# Patient Record
Sex: Female | Born: 1955 | Race: White | Hispanic: No | Marital: Married | Smoking: Never smoker
Health system: Southern US, Community
[De-identification: ages and names within clinical notes are randomized; demographics above are authoritative.]

## PROBLEM LIST (undated history)

## (undated) DIAGNOSIS — F419 Anxiety disorder, unspecified: Secondary | ICD-10-CM

## (undated) DIAGNOSIS — T7840XA Allergy, unspecified, initial encounter: Secondary | ICD-10-CM

## (undated) DIAGNOSIS — F329 Major depressive disorder, single episode, unspecified: Secondary | ICD-10-CM

## (undated) DIAGNOSIS — N6019 Diffuse cystic mastopathy of unspecified breast: Secondary | ICD-10-CM

## (undated) DIAGNOSIS — F32A Depression, unspecified: Secondary | ICD-10-CM

## (undated) DIAGNOSIS — N809 Endometriosis, unspecified: Secondary | ICD-10-CM

## (undated) DIAGNOSIS — E559 Vitamin D deficiency, unspecified: Secondary | ICD-10-CM

## (undated) HISTORY — DX: Diffuse cystic mastopathy of unspecified breast: N60.19

## (undated) HISTORY — DX: Anxiety disorder, unspecified: F41.9

## (undated) HISTORY — PX: LAPAROTOMY: SHX154

## (undated) HISTORY — PX: LASIK: SHX215

## (undated) HISTORY — DX: Endometriosis, unspecified: N80.9

## (undated) HISTORY — DX: Depression, unspecified: F32.A

## (undated) HISTORY — DX: Vitamin D deficiency, unspecified: E55.9

## (undated) HISTORY — DX: Allergy, unspecified, initial encounter: T78.40XA

## (undated) HISTORY — DX: Major depressive disorder, single episode, unspecified: F32.9

---

## 1999-01-31 ENCOUNTER — Other Ambulatory Visit: Admission: RE | Admit: 1999-01-31 | Discharge: 1999-01-31 | Payer: Self-pay | Admitting: *Deleted

## 2000-04-21 ENCOUNTER — Other Ambulatory Visit: Admission: RE | Admit: 2000-04-21 | Discharge: 2000-04-21 | Payer: Self-pay | Admitting: Obstetrics and Gynecology

## 2001-06-21 ENCOUNTER — Other Ambulatory Visit: Admission: RE | Admit: 2001-06-21 | Discharge: 2001-06-21 | Payer: Self-pay | Admitting: Internal Medicine

## 2002-08-22 ENCOUNTER — Other Ambulatory Visit: Admission: RE | Admit: 2002-08-22 | Discharge: 2002-08-22 | Payer: Self-pay | Admitting: Obstetrics and Gynecology

## 2003-07-12 ENCOUNTER — Ambulatory Visit (HOSPITAL_COMMUNITY): Admission: RE | Admit: 2003-07-12 | Discharge: 2003-07-12 | Payer: Self-pay | Admitting: Internal Medicine

## 2003-07-12 ENCOUNTER — Other Ambulatory Visit: Admission: RE | Admit: 2003-07-12 | Discharge: 2003-07-12 | Payer: Self-pay | Admitting: Internal Medicine

## 2004-07-24 ENCOUNTER — Other Ambulatory Visit: Admission: RE | Admit: 2004-07-24 | Discharge: 2004-07-24 | Payer: Self-pay | Admitting: Internal Medicine

## 2005-03-20 ENCOUNTER — Encounter: Admission: RE | Admit: 2005-03-20 | Discharge: 2005-03-20 | Payer: Self-pay | Admitting: Radiology

## 2005-10-10 ENCOUNTER — Ambulatory Visit (HOSPITAL_BASED_OUTPATIENT_CLINIC_OR_DEPARTMENT_OTHER): Admission: RE | Admit: 2005-10-10 | Discharge: 2005-10-10 | Payer: Self-pay | Admitting: General Surgery

## 2010-05-31 ENCOUNTER — Other Ambulatory Visit: Payer: Self-pay | Admitting: Obstetrics & Gynecology

## 2013-06-21 ENCOUNTER — Other Ambulatory Visit: Payer: Self-pay | Admitting: Internal Medicine

## 2013-06-22 ENCOUNTER — Telehealth: Payer: Self-pay | Admitting: Internal Medicine

## 2013-06-22 NOTE — Telephone Encounter (Signed)
Left message for pt to call and sch a follow up  Thanks, kat

## 2013-06-27 ENCOUNTER — Ambulatory Visit (INDEPENDENT_AMBULATORY_CARE_PROVIDER_SITE_OTHER): Payer: BC Managed Care – PPO | Admitting: Physician Assistant

## 2013-06-27 VITALS — BP 102/62 | HR 64 | Temp 98.1°F | Resp 16 | Ht 69.0 in | Wt 176.0 lb

## 2013-06-27 DIAGNOSIS — Z1331 Encounter for screening for depression: Secondary | ICD-10-CM

## 2013-06-27 DIAGNOSIS — E559 Vitamin D deficiency, unspecified: Secondary | ICD-10-CM

## 2013-06-27 DIAGNOSIS — Z79899 Other long term (current) drug therapy: Secondary | ICD-10-CM

## 2013-06-27 DIAGNOSIS — Z0000 Encounter for general adult medical examination without abnormal findings: Secondary | ICD-10-CM

## 2013-06-27 DIAGNOSIS — Z23 Encounter for immunization: Secondary | ICD-10-CM

## 2013-06-27 LAB — CBC WITH DIFFERENTIAL/PLATELET
Basophils Absolute: 0.1 10*3/uL (ref 0.0–0.1)
Basophils Relative: 1 % (ref 0–1)
Eosinophils Absolute: 0.1 10*3/uL (ref 0.0–0.7)
Eosinophils Relative: 2 % (ref 0–5)
HCT: 39 % (ref 36.0–46.0)
Hemoglobin: 13 g/dL (ref 12.0–15.0)
LYMPHS ABS: 1.6 10*3/uL (ref 0.7–4.0)
LYMPHS PCT: 25 % (ref 12–46)
MCH: 29.5 pg (ref 26.0–34.0)
MCHC: 33.3 g/dL (ref 30.0–36.0)
MCV: 88.4 fL (ref 78.0–100.0)
Monocytes Absolute: 0.6 10*3/uL (ref 0.1–1.0)
Monocytes Relative: 10 % (ref 3–12)
NEUTROS ABS: 3.9 10*3/uL (ref 1.7–7.7)
NEUTROS PCT: 62 % (ref 43–77)
PLATELETS: 224 10*3/uL (ref 150–400)
RBC: 4.41 MIL/uL (ref 3.87–5.11)
RDW: 12.9 % (ref 11.5–15.5)
WBC: 6.3 10*3/uL (ref 4.0–10.5)

## 2013-06-27 LAB — HEMOGLOBIN A1C
HEMOGLOBIN A1C: 5.3 % (ref <5.7)
Mean Plasma Glucose: 105 mg/dL (ref <117)

## 2013-06-27 MED ORDER — SERTRALINE HCL 50 MG PO TABS: 50.0000 mg | ORAL_TABLET | Freq: Every day | ORAL | Status: DC

## 2013-06-27 NOTE — Progress Notes (Signed)
Complete Physical HPI 58 y.o. female  presents for a complete physical. Her blood pressure has been controlled at home, today their BP is BP: 102/62 mmHg She does workout, pure barre, 3 times a week. She denies chest pain, shortness of breath, dizziness.  She is not on cholesterol medication and denies myalgias. Her cholesterol is at goal. The cholesterol last visit was:  LDL 115 Patient is on Vitamin D supplement.  Vitamin 60  Current Medications:  No current outpatient prescriptions on file prior to visit.   No current facility-administered medications on file prior to visit.   Health Maintenance:    There is no immunization history on file for this patient.  Tetanus: 2004 Pneumovax: N/A Flu vaccine: Declines Zostavax: N/A Pap: 2014, never abnormal pap, due 2017 MGM: 03/2013 DEXA: N/A Colonoscopy: 2008 due 2018, Dr. Randa EvensEdwards EGD: N/A  Allergies:  Allergies  Allergen Reactions  . Sulfa Antibiotics    Medical History: No past medical history on file. Surgical History: No past surgical history on file. Family History: No family history on file. Social History:  History  Substance Use Topics  . Smoking status: Not on file  . Smokeless tobacco: Not on file  . Alcohol Use: Not on file   Review of Systems: [X]  = complains of  [ ]  = denies  General: Fatigue [ ]  Fever [ ]  Chills [ ]  Weakness [ ]   Insomnia [ ] Weight change [ ]  Night sweats [ ]   Change in appetite [ ]  Eyes: Redness [ ]  Blurred vision [ ]  Diplopia [ ]  Discharge [ ]   ENT: Congestion [ ]  Sinus Pain [ ]  Post Nasal Drip [ ]  Sore Throat [ ]  Earache [ ]  hearing loss [ ]  Tinnitus [ ]  Snoring [ ]   Cardiac: Chest pain/pressure [ ]  SOB [ ]  Orthopnea [ ]   Palpitations [ ]   Paroxysmal nocturnal dyspnea[ ]  Claudication [ ]  Edema [ ]   Pulmonary: Cough [ ]  Wheezing[ ]   SOB [ ]   Pleurisy [ ]   GI: Nausea [ ]  Vomiting[ ]  Dysphagia[ ]  Heartburn[ ]  Abdominal pain [ ]  Constipation [ ] ; Diarrhea [ ]  BRBPR [ ]  Melena[ ]  Bloating [ ]   Hemorrhoids [ ]   GU: Hematuria[ ]  Dysuria [ ]  Nocturia[ ]  Urgency [ ]   Hesitancy [ ]  Discharge [ ]  Frequency [ ]   Breast:  Breast lumps [ ]   nipple discharge [ ]    Neuro: Headaches[ ]  Vertigo[ ]  Paresthesias[ ]  Spasm [ ]  Speech changes [ ]  Incoordination [ ]   Ortho: Arthritis [ ]  Joint pain [ ]  Muscle pain [ ]  Joint swelling [ ]  Back Pain [ ]  Skin:  Rash [ ]   Pruritis [ ]  Change in skin lesion [ ]   Psych: Depression[ ]  Anxiety[ ]  Confusion [ ]  Memory loss [ ]   Heme/Lypmh: Bleeding [ ]  Bruising [ ]  Enlarged lymph nodes [ ]   Endocrine: Visual blurring [ ]  Paresthesia [ ]  Polyuria [ ]  Polydypsea [ ]    Heat/cold intolerance [ ]  Hypoglycemia [ ]   Physical Exam: Estimated body mass index is 25.98 kg/(m^2) as calculated from the following:   Height as of this encounter: 5\' 9"  (1.753 m).   Weight as of this encounter: 176 lb (79.833 kg). BP 102/62  Pulse 64  Temp(Src) 98.1 F (36.7 C)  Resp 16  Ht 5\' 9"  (1.753 m)  Wt 176 lb (79.833 kg)  BMI 25.98 kg/m2 General Appearance: Well nourished, in no apparent distress. Eyes: PERRLA, EOMs, conjunctiva no swelling or erythema, normal  fundi and vessels. Sinuses: No Frontal/maxillary tenderness ENT/Mouth: Ext aud canals clear, normal light reflex with TMs without erythema, bulging.  Good dentition. No erythema, swelling, or exudate on post pharynx. Tonsils not swollen or erythematous. Hearing normal.  Neck: Supple, thyroid normal. No bruits Respiratory: Respiratory effort normal, BS equal bilaterally without rales, rhonchi, wheezing or stridor. Cardio: RRR without murmurs, rubs or gallops. Brisk peripheral pulses without edema.  Chest: symmetric, with normal excursions and percussion. Breasts: defer Abdomen: Soft, +BS. Non tender, no guarding, rebound, hernias, masses, or organomegaly. .  Lymphatics: Non tender without lymphadenopathy.  Genitourinary: defer Musculoskeletal: Full ROM all peripheral extremities,5/5 strength, and normal gait. Skin:  Warm, dry without rashes, lesions, ecchymosis.  Neuro: Cranial nerves intact, reflexes equal bilaterally. Normal muscle tone, no cerebellar symptoms. Sensation intact.  Psych: Awake and oriented X 3, normal affect, Insight and Judgment appropriate.   EKG: WNL no changes.  Assessment and Plan: Screening for depression- neg continue zoloft Health Maintenance TDAP  Discussed med's effects and SE's. Screening labs and tests as requested with regular follow-up as recommended.   Colleen Alexander 3:35 PM

## 2013-06-27 NOTE — Patient Instructions (Signed)

## 2013-06-28 LAB — BASIC METABOLIC PANEL WITH GFR
BUN: 18 mg/dL (ref 6–23)
CHLORIDE: 100 meq/L (ref 96–112)
CO2: 27 meq/L (ref 19–32)
Calcium: 9.2 mg/dL (ref 8.4–10.5)
Creat: 0.79 mg/dL (ref 0.50–1.10)
GFR, Est African American: >89 mL/min
GFR, Est Non African American: 84 mL/min
Glucose, Bld: 76 mg/dL (ref 70–99)
POTASSIUM: 3.9 meq/L (ref 3.5–5.3)
SODIUM: 139 meq/L (ref 135–145)

## 2013-06-28 LAB — URINALYSIS, ROUTINE W REFLEX MICROSCOPIC
Bilirubin Urine: NEGATIVE
GLUCOSE, UA: NEGATIVE mg/dL
HGB URINE DIPSTICK: NEGATIVE
Ketones, ur: NEGATIVE mg/dL
LEUKOCYTES UA: NEGATIVE
Nitrite: NEGATIVE
PH: 6.5 (ref 5.0–8.0)
Protein, ur: NEGATIVE mg/dL
SPECIFIC GRAVITY, URINE: 1.027 (ref 1.005–1.030)
Urobilinogen, UA: 0.2 mg/dL (ref 0.0–1.0)

## 2013-06-28 LAB — LIPID PANEL
CHOL/HDL RATIO: 2.7 ratio
Cholesterol: 173 mg/dL (ref 0–200)
HDL: 64 mg/dL (ref >39)
LDL Cholesterol: 79 mg/dL (ref 0–99)
Triglycerides: 151 mg/dL — ABNORMAL HIGH (ref <150)
VLDL: 30 mg/dL (ref 0–40)

## 2013-06-28 LAB — FERRITIN: Ferritin: 27 ng/mL (ref 10–291)

## 2013-06-28 LAB — HEPATIC FUNCTION PANEL
ALK PHOS: 52 U/L (ref 39–117)
ALT: 18 U/L (ref 0–35)
AST: 21 U/L (ref 0–37)
Albumin: 4.1 g/dL (ref 3.5–5.2)
BILIRUBIN DIRECT: 0.1 mg/dL (ref 0.0–0.3)
BILIRUBIN TOTAL: 0.6 mg/dL (ref 0.2–1.2)
Indirect Bilirubin: 0.5 mg/dL (ref 0.2–1.2)
Total Protein: 6.6 g/dL (ref 6.0–8.3)

## 2013-06-28 LAB — IRON AND TIBC
%SAT: 32 % (ref 20–55)
Iron: 98 ug/dL (ref 42–145)
TIBC: 307 ug/dL (ref 250–470)
UIBC: 209 ug/dL (ref 125–400)

## 2013-06-28 LAB — MAGNESIUM: Magnesium: 1.9 mg/dL (ref 1.5–2.5)

## 2013-06-28 LAB — INSULIN, FASTING: Insulin fasting, serum: 10 u[IU]/mL (ref 3–28)

## 2013-06-28 LAB — VITAMIN D 25 HYDROXY (VIT D DEFICIENCY, FRACTURES): Vit D, 25-Hydroxy: 55 ng/mL (ref 30–89)

## 2013-06-28 LAB — TSH: TSH: 1.064 u[IU]/mL (ref 0.350–4.500)

## 2013-06-28 LAB — VITAMIN B12: VITAMIN B 12: 962 pg/mL — AB (ref 211–911)

## 2013-06-28 LAB — MICROALBUMIN / CREATININE URINE RATIO
CREATININE, URINE: 124.7 mg/dL
MICROALB/CREAT RATIO: 4 mg/g (ref 0.0–30.0)
Microalb, Ur: 0.5 mg/dL (ref 0.00–1.89)

## 2014-06-28 ENCOUNTER — Other Ambulatory Visit: Payer: Self-pay | Admitting: Physician Assistant

## 2014-07-03 ENCOUNTER — Encounter: Payer: Self-pay | Admitting: Physician Assistant

## 2014-07-03 ENCOUNTER — Ambulatory Visit (INDEPENDENT_AMBULATORY_CARE_PROVIDER_SITE_OTHER): Payer: BLUE CROSS/BLUE SHIELD | Admitting: Physician Assistant

## 2014-07-03 VITALS — BP 112/64 | HR 86 | Temp 98.0°F | Resp 16 | Ht 68.75 in | Wt 177.0 lb

## 2014-07-03 DIAGNOSIS — Z79899 Other long term (current) drug therapy: Secondary | ICD-10-CM

## 2014-07-03 DIAGNOSIS — N6019 Diffuse cystic mastopathy of unspecified breast: Secondary | ICD-10-CM

## 2014-07-03 DIAGNOSIS — T7840XD Allergy, unspecified, subsequent encounter: Secondary | ICD-10-CM

## 2014-07-03 DIAGNOSIS — F329 Major depressive disorder, single episode, unspecified: Secondary | ICD-10-CM

## 2014-07-03 DIAGNOSIS — Z0001 Encounter for general adult medical examination with abnormal findings: Secondary | ICD-10-CM

## 2014-07-03 DIAGNOSIS — E559 Vitamin D deficiency, unspecified: Secondary | ICD-10-CM

## 2014-07-03 DIAGNOSIS — Z1212 Encounter for screening for malignant neoplasm of rectum: Secondary | ICD-10-CM

## 2014-07-03 DIAGNOSIS — T7840XA Allergy, unspecified, initial encounter: Secondary | ICD-10-CM | POA: Insufficient documentation

## 2014-07-03 DIAGNOSIS — F325 Major depressive disorder, single episode, in full remission: Secondary | ICD-10-CM | POA: Insufficient documentation

## 2014-07-03 DIAGNOSIS — F419 Anxiety disorder, unspecified: Secondary | ICD-10-CM

## 2014-07-03 DIAGNOSIS — R6889 Other general symptoms and signs: Secondary | ICD-10-CM

## 2014-07-03 DIAGNOSIS — E781 Pure hyperglyceridemia: Secondary | ICD-10-CM | POA: Insufficient documentation

## 2014-07-03 DIAGNOSIS — F32A Depression, unspecified: Secondary | ICD-10-CM

## 2014-07-03 LAB — BASIC METABOLIC PANEL WITH GFR
BUN: 14 mg/dL (ref 6–23)
CALCIUM: 9.4 mg/dL (ref 8.4–10.5)
CO2: 32 mEq/L (ref 19–32)
CREATININE: 0.69 mg/dL (ref 0.50–1.10)
Chloride: 102 mEq/L (ref 96–112)
Glucose, Bld: 72 mg/dL (ref 70–99)
Potassium: 4 mEq/L (ref 3.5–5.3)
Sodium: 142 mEq/L (ref 135–145)

## 2014-07-03 LAB — LIPID PANEL
Cholesterol: 198 mg/dL (ref 0–200)
HDL: 66 mg/dL (ref >=46)
LDL Cholesterol: 114 mg/dL — ABNORMAL HIGH (ref 0–99)
Total CHOL/HDL Ratio: 3 Ratio
Triglycerides: 90 mg/dL (ref <150)
VLDL: 18 mg/dL (ref 0–40)

## 2014-07-03 LAB — HEPATIC FUNCTION PANEL
ALBUMIN: 4.4 g/dL (ref 3.5–5.2)
ALK PHOS: 54 U/L (ref 39–117)
ALT: 15 U/L (ref 0–35)
AST: 20 U/L (ref 0–37)
BILIRUBIN DIRECT: 0.1 mg/dL (ref 0.0–0.3)
BILIRUBIN TOTAL: 0.6 mg/dL (ref 0.2–1.2)
Indirect Bilirubin: 0.5 mg/dL (ref 0.2–1.2)
Total Protein: 6.9 g/dL (ref 6.0–8.3)

## 2014-07-03 LAB — CBC WITH DIFFERENTIAL/PLATELET
Basophils Absolute: 0.1 10*3/uL (ref 0.0–0.1)
Basophils Relative: 1 % (ref 0–1)
EOS PCT: 1 % (ref 0–5)
Eosinophils Absolute: 0.1 10*3/uL (ref 0.0–0.7)
HEMATOCRIT: 41.4 % (ref 36.0–46.0)
HEMOGLOBIN: 13.6 g/dL (ref 12.0–15.0)
LYMPHS ABS: 1.8 10*3/uL (ref 0.7–4.0)
LYMPHS PCT: 27 % (ref 12–46)
MCH: 29.2 pg (ref 26.0–34.0)
MCHC: 32.9 g/dL (ref 30.0–36.0)
MCV: 88.8 fL (ref 78.0–100.0)
MONO ABS: 0.5 10*3/uL (ref 0.1–1.0)
MONOS PCT: 7 % (ref 3–12)
MPV: 10.7 fL (ref 8.6–12.4)
NEUTROS ABS: 4.2 10*3/uL (ref 1.7–7.7)
Neutrophils Relative %: 64 % (ref 43–77)
Platelets: 264 10*3/uL (ref 150–400)
RBC: 4.66 MIL/uL (ref 3.87–5.11)
RDW: 13.4 % (ref 11.5–15.5)
WBC: 6.6 10*3/uL (ref 4.0–10.5)

## 2014-07-03 LAB — MAGNESIUM: Magnesium: 1.9 mg/dL (ref 1.5–2.5)

## 2014-07-03 MED ORDER — SERTRALINE HCL 50 MG PO TABS: 50.0000 mg | ORAL_TABLET | Freq: Every day | ORAL | Status: DC

## 2014-07-03 NOTE — Progress Notes (Signed)
Complete Physical  Assessment and Plan: 1. Anxiety continue medications, stress management techniques discussed, increase water, good sleep hygiene discussed, increase exercise, and increase veggies.  - TSH - sertraline (ZOLOFT) 50 MG tablet; Take 1 tablet (50 mg total) by mouth daily.  Dispense: 90 tablet; Refill: 3  2. Depression - sertraline (ZOLOFT) 50 MG tablet; Take 1 tablet (50 mg total) by mouth daily.  Dispense: 90 tablet; Refill: 3  3. Vitamin D deficiency Continue supplement - Vit D  25 hydroxy (rtn osteoporosis monitoring)  4. Allergy, subsequent encounter Allergic rhinitis - Allegra OTC, increase H20, allergy hygiene explained.  5. Fibrocystic breast disease, unspecified laterality Self breast exams, get MGM 3D  6. Hypertriglyceridemia -continue medications, check lipids, decrease fatty foods, increase activity.  - Lipid panel - Microalbumin / creatinine urine ratio - Urinalysis, Routine w reflex microscopic  7. Encounter for general adult medical examination with abnormal findings  8. Medication management - CBC with Differential/Platelet - BASIC METABOLIC PANEL WITH GFR - Hepatic function panel - Magnesium  Health Maintenance Discussed med's effects and SE's. Screening labs and tests as requested with regular follow-up as recommended.  HPI 59 y.o. female  presents for a complete physical. Her blood pressure has been controlled at home, today their BP is BP: 112/64 mmHg She does workout, pure barre, 3 times a week. She denies chest pain, shortness of breath, dizziness.  She is not on cholesterol medication and denies myalgias. Her cholesterol is at goal. The cholesterol last visit was:   Lab Results  Component Value Date   CHOL 173 06/27/2013   HDL 64 06/27/2013   LDLCALC 79 06/27/2013   TRIG 151* 06/27/2013   CHOLHDL 2.7 06/27/2013   Patient is on Vitamin D supplement, Vitamin D 60 She is on zoloft for depression/anxiety which helps.  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  Her husband has lost his mom and had some deaths in the family.   Current Medications:  Current Outpatient Prescriptions on File Prior to Visit  Medication Sig Dispense Refill  . B Complex Vitamins (B COMPLEX PO) Take by mouth daily.    . Cholecalciferol (VITAMIN D PO) Take 3,600 Int'l Units by mouth daily.    Marland Kitchen. OVER THE COUNTER MEDICATION daily. VITALIZER STRIP    . OVER THE COUNTER MEDICATION daily. NUTRIFERON    . OVER THE COUNTER MEDICATION daily. OSTEOMATRIX    . OVER THE COUNTER MEDICATION daily. MENOPAUSE BALANCING COMPLEX    . OVER THE COUNTER MEDICATION daily. MINDWORKS    . OVER THE COUNTER MEDICATION daily. SOY PROTEIN DRINK IN A.M.    . sertraline (ZOLOFT) 50 MG tablet TAKE 1 TABLET (50 MG TOTAL) BY MOUTH DAILY. 90 tablet 0   No current facility-administered medications on file prior to visit.   Health Maintenance:   Immunization History  Administered Date(s) Administered  . Tdap 06/27/2013   Tetanus: 2015 Pneumovax: N/A Prevnar 13: N/A Flu vaccine: Declines Zostavax: N/A Pap: 2014, never abnormal pap, due 2017 MGM: 05/2014 at [facility] DEXA: N/A, has not had, no fractures, non smoker, no fm hx.  Colonoscopy: 2008 due 2018, Dr. Randa EvensEdwards EGD: N/A  Allergies:  Allergies  Allergen Reactions  . Sulfa Antibiotics    Medical History:  Past Medical History  Diagnosis Date  . Anxiety   . Depression   . Allergy   . Fibrocystic breast disease   . Endometriosis   . Vitamin D deficiency    Surgical History:  Past Surgical History  Procedure Laterality Date  . Lasik    . Laparotomy      for endometriosis   Family History:  Family History  Problem Relation Age of Onset  . Hypertension Mother   . Hypertension Father   . Heart disease Father   . COPD Father   . Diabetes Father    Social History:  History  Substance Use Topics  . Smoking status: Never Smoker   . Smokeless tobacco: Never  Used  . Alcohol Use: No   Review of Systems  Constitutional: Negative.   HENT: Negative.   Eyes: Negative.   Respiratory: Negative.   Cardiovascular: Negative.   Gastrointestinal: Negative.   Genitourinary: Negative.   Musculoskeletal: Negative.   Skin: Negative.  Negative for itching and rash.       Several seb keratosis all over back, would like frozen off.   Neurological: Negative.   Endo/Heme/Allergies: Negative.   Psychiatric/Behavioral: Negative.     Physical Exam: Estimated body mass index is 26.34 kg/(m^2) as calculated from the following:   Height as of this encounter: 5' 8.75" (1.746 m).   Weight as of this encounter: 177 lb (80.287 kg). BP 112/64 mmHg  Pulse 86  Temp(Src) 98 F (36.7 C) (Temporal)  Resp 16  Ht 5' 8.75" (1.746 m)  Wt 177 lb (80.287 kg)  BMI 26.34 kg/m2 General Appearance: Well nourished, in no apparent distress. Eyes: PERRLA, EOMs, conjunctiva no swelling or erythema, normal fundi and vessels. Sinuses: No Frontal/maxillary tenderness ENT/Mouth: Ext aud canals clear, normal light reflex with TMs without erythema, bulging.  Good dentition. No erythema, swelling, or exudate on post pharynx. Tonsils not swollen or erythematous. Hearing normal.  Neck: Supple, thyroid normal. No bruits Respiratory: Respiratory effort normal, BS equal bilaterally without rales, rhonchi, wheezing or stridor. Cardio: RRR without murmurs, rubs or gallops. Brisk peripheral pulses without edema.  Chest: symmetric, with normal excursions and percussion. Breasts: defer Abdomen: Soft, +BS. Non tender, no guarding, rebound, hernias, masses, or organomegaly. .  Lymphatics: Non tender without lymphadenopathy.  Genitourinary: defer Musculoskeletal: Full ROM all peripheral extremities,5/5 strength, and normal gait. Skin: Several seb keratosis on back, chest. Warm, dry without rashes,ecchymosis.  Neuro: Cranial nerves intact, reflexes equal bilaterally. Normal muscle tone, no  cerebellar symptoms. Sensation intact.  Psych: Awake and oriented X 3, normal affect, Insight and Judgment appropriate.   EKG: defer    Quentin Mulling 2:31 PM

## 2014-07-03 NOTE — Patient Instructions (Addendum)
Preventive Care for Adults A healthy lifestyle and preventive care can promote health and wellness. Preventive health guidelines for women include the following key practices.  A routine yearly physical is a good way to check with your health care provider about your health and preventive screening. It is a chance to share any concerns and updates on your health and to receive a thorough exam.  Visit your dentist for a routine exam and preventive care every 6 months. Brush your teeth twice a day and floss once a day. Good oral hygiene prevents tooth decay and gum disease.  The frequency of eye exams is based on your age, health, family medical history, use of contact lenses, and other factors. Follow your health care provider's recommendations for frequency of eye exams.  Eat a healthy diet. Foods like vegetables, fruits, whole grains, low-fat dairy products, and lean protein foods contain the nutrients you need without too many calories. Decrease your intake of foods high in solid fats, added sugars, and salt. Eat the right amount of calories for you.Get information about a proper diet from your health care provider, if necessary.  Regular physical exercise is one of the most important things you can do for your health. Most adults should get at least 150 minutes of moderate-intensity exercise (any activity that increases your heart rate and causes you to sweat) each week. In addition, most adults need muscle-strengthening exercises on 2 or more days a week.  Maintain a healthy weight. The body mass index (BMI) is a screening tool to identify possible weight problems. It provides an estimate of body fat based on height and weight. Your health care provider can find your BMI and can help you achieve or maintain a healthy weight.For adults 20 years and older:  A BMI below 18.5 is considered underweight.  A BMI of 18.5 to 24.9 is normal.  A BMI of 25 to 29.9 is considered overweight.  A BMI of  30 and above is considered obese.  Maintain normal blood lipids and cholesterol levels by exercising and minimizing your intake of saturated fat. Eat a balanced diet with plenty of fruit and vegetables. If your lipid or cholesterol levels are high, you are over 50, or you are at high risk for heart disease, you may need your cholesterol levels checked more frequently.Ongoing high lipid and cholesterol levels should be treated with medicines if diet and exercise are not working.  If you smoke, find out from your health care provider how to quit. If you do not use tobacco, do not start.  Lung cancer screening is recommended for adults aged 86-80 years who are at high risk for developing lung cancer because of a history of smoking. A yearly low-dose CT scan of the lungs is recommended for people who have at least a 30-pack-year history of smoking and are a current smoker or have quit within the past 15 years. A pack year of smoking is smoking an average of 1 pack of cigarettes a day for 1 year (for example: 1 pack a day for 30 years or 2 packs a day for 15 years). Yearly screening should continue until the smoker has stopped smoking for at least 15 years. Yearly screening should be stopped for people who develop a health problem that would prevent them from having lung cancer treatment.  Avoid use of street drugs. Do not share needles with anyone. Ask for help if you need support or instructions about stopping the use of drugs.  High blood  pressure causes heart disease and increases the risk of stroke.  Ongoing high blood pressure should be treated with medicines if weight loss and exercise do not work.  If you are 55-79 years old, ask your health care provider if you should take aspirin to prevent strokes.  Diabetes screening involves taking a blood sample to check your fasting blood sugar level. This should be done once every 3 years, after age 45, if you are within normal weight and without risk  factors for diabetes. Testing should be considered at a younger age or be carried out more frequently if you are overweight and have at least 1 risk factor for diabetes.  Breast cancer screening is essential preventive care for women. You should practice "breast self-awareness." This means understanding the normal appearance and feel of your breasts and may include breast self-examination. Any changes detected, no matter how small, should be reported to a health care provider. Women in their 20s and 30s should have a clinical breast exam (CBE) by a health care provider as part of a regular health exam every 1 to 3 years. After age 40, women should have a CBE every year. Starting at age 40, women should consider having a mammogram (breast X-ray test) every year. Women who have a family history of breast cancer should talk to their health care provider about genetic screening. Women at a high risk of breast cancer should talk to their health care providers about having an MRI and a mammogram every year.  Breast cancer gene (BRCA)-related cancer risk assessment is recommended for women who have family members with BRCA-related cancers. BRCA-related cancers include breast, ovarian, tubal, and peritoneal cancers. Having family members with these cancers may be associated with an increased risk for harmful changes (mutations) in the breast cancer genes BRCA1 and BRCA2. Results of the assessment will determine the need for genetic counseling and BRCA1 and BRCA2 testing.  Routine pelvic exams to screen for cancer are no longer recommended for nonpregnant women who are considered low risk for cancer of the pelvic organs (ovaries, uterus, and vagina) and who do not have symptoms. Ask your health care provider if a screening pelvic exam is right for you.  If you have had past treatment for cervical cancer or a condition that could lead to cancer, you need Pap tests and screening for cancer for at least 20 years after  your treatment. If Pap tests have been discontinued, your risk factors (such as having a new sexual partner) need to be reassessed to determine if screening should be resumed. Some women have medical problems that increase the chance of getting cervical cancer. In these cases, your health care provider may recommend more frequent screening and Pap tests.    Colorectal cancer can be detected and often prevented. Most routine colorectal cancer screening begins at the age of 50 years and continues through age 75 years. However, your health care provider may recommend screening at an earlier age if you have risk factors for colon cancer. On a yearly basis, your health care provider may provide home test kits to check for hidden blood in the stool. Use of a small camera at the end of a tube, to directly examine the colon (sigmoidoscopy or colonoscopy), can detect the earliest forms of colorectal cancer. Talk to your health care provider about this at age 50, when routine screening begins. Direct exam of the colon should be repeated every 5-10 years through age 75 years, unless early forms of pre-cancerous polyps   or small growths are found.  Osteoporosis is a disease in which the bones lose minerals and strength with aging. This can result in serious bone fractures or breaks. The risk of osteoporosis can be identified using a bone density scan. Women ages 62 years and over and women at risk for fractures or osteoporosis should discuss screening with their health care providers. Ask your health care provider whether you should take a calcium supplement or vitamin D to reduce the rate of osteoporosis.  Menopause can be associated with physical symptoms and risks. Hormone replacement therapy is available to decrease symptoms and risks. You should talk to your health care provider about whether hormone replacement therapy is right for you.  Use sunscreen. Apply sunscreen liberally and repeatedly throughout the day.  You should seek shade when your shadow is shorter than you. Protect yourself by wearing long sleeves, pants, a wide-brimmed hat, and sunglasses year round, whenever you are outdoors.  Once a month, do a whole body skin exam, using a mirror to look at the skin on your back. Tell your health care provider of new moles, moles that have irregular borders, moles that are larger than a pencil eraser, or moles that have changed in shape or color.  Stay current with required vaccines (immunizations).  Influenza vaccine. All adults should be immunized every year.  Tetanus, diphtheria, and acellular pertussis (Td, Tdap) vaccine. Pregnant women should receive 1 dose of Tdap vaccine during each pregnancy. The dose should be obtained regardless of the length of time since the last dose. Immunization is preferred during the 27th-36th week of gestation. An adult who has not previously received Tdap or who does not know her vaccine status should receive 1 dose of Tdap. This initial dose should be followed by tetanus and diphtheria toxoids (Td) booster doses every 10 years. Adults with an unknown or incomplete history of completing a 3-dose immunization series with Td-containing vaccines should begin or complete a primary immunization series including a Tdap dose. Adults should receive a Td booster every 10 years.    Zoster vaccine. One dose is recommended for adults aged 4 years or older unless certain conditions are present.    Pneumococcal 13-valent conjugate (PCV13) vaccine. When indicated, a person who is uncertain of her immunization history and has no record of immunization should receive the PCV13 vaccine. An adult aged 35 years or older who has certain medical conditions and has not been previously immunized should receive 1 dose of PCV13 vaccine. This PCV13 should be followed with a dose of pneumococcal polysaccharide (PPSV23) vaccine. The PPSV23 vaccine dose should be obtained at least 8 weeks after the  dose of PCV13 vaccine. An adult aged 85 years or older who has certain medical conditions and previously received 1 or more doses of PPSV23 vaccine should receive 1 dose of PCV13. The PCV13 vaccine dose should be obtained 1 or more years after the last PPSV23 vaccine dose.    Pneumococcal polysaccharide (PPSV23) vaccine. When PCV13 is also indicated, PCV13 should be obtained first. All adults aged 15 years and older should be immunized. An adult younger than age 38 years who has certain medical conditions should be immunized. Any person who resides in a nursing home or long-term care facility should be immunized. An adult smoker should be immunized. People with an immunocompromised condition and certain other conditions should receive both PCV13 and PPSV23 vaccines. People with human immunodeficiency virus (HIV) infection should be immunized as soon as possible after diagnosis. Immunization during  chemotherapy or radiation therapy should be avoided. Routine use of PPSV23 vaccine is not recommended for American Indians, [place] Natives, or people younger than 65 years unless there are medical conditions that require PPSV23 vaccine. When indicated, people who have unknown immunization and have no record of immunization should receive PPSV23 vaccine. One-time revaccination 5 years after the first dose of PPSV23 is recommended for people aged 19-64 years who have chronic kidney failure, nephrotic syndrome, asplenia, or immunocompromised conditions. People who received 1-2 doses of PPSV23 before age 38 years should receive another dose of PPSV23 vaccine at age 45 years or later if at least 5 years have passed since the previous dose. Doses of PPSV23 are not needed for people immunized with PPSV23 at or after age 12 years.   Preventive Services / Frequency  Ages 14 years and over  Blood pressure check.  Lipid and cholesterol check.  Lung cancer screening. / Every year if you are aged 50-80 years and have a  30-pack-year history of smoking and currently smoke or have quit within the past 15 years. Yearly screening is stopped once you have quit smoking for at least 15 years or develop a health problem that would prevent you from having lung cancer treatment.  Clinical breast exam.** / Every year after age 17 years.  BRCA-related cancer risk assessment.** / For women who have family members with a BRCA-related cancer (breast, ovarian, tubal, or peritoneal cancers).  Mammogram.** / Every year beginning at age 35 years and continuing for as long as you are in good health. Consult with your health care provider.  Pap test.** / Every 3 years starting at age 39 years through age 61 or 89 years with 3 consecutive normal Pap tests. Testing can be stopped between 65 and 70 years with 3 consecutive normal Pap tests and no abnormal Pap or HPV tests in the past 10 years.  Fecal occult blood test (FOBT) of stool. / Every year beginning at age 59 years and continuing until age 67 years. You may not need to do this test if you get a colonoscopy every 10 years.  Flexible sigmoidoscopy or colonoscopy.** / Every 5 years for a flexible sigmoidoscopy or every 10 years for a colonoscopy beginning at age 31 years and continuing until age 7 years.  Hepatitis C blood test.** / For all people born from [birth year] through 1964 and any individual with known risks for hepatitis C.  Osteoporosis screening.** / A one-time screening for women ages 26 years and over and women at risk for fractures or osteoporosis.  Skin self-exam. / Monthly.  Influenza vaccine. / Every year.  Tetanus, diphtheria, and acellular pertussis (Tdap/Td) vaccine.** / 1 dose of Td every 10 years.  Zoster vaccine.** / 1 dose for adults aged 65 years or older.  Pneumococcal 13-valent conjugate (PCV13) vaccine.** / Consult your health care provider.  Pneumococcal polysaccharide (PPSV23) vaccine.** / 1 dose for all adults aged 60 years and older. Screening  for abdominal aortic aneurysm (AAA)  by ultrasound is recommended for people who have history of high blood pressure or who are current or former smokers.  Diabetes is a very complicated disease...lets simplify it.  An easy way to look at it to understand the complications is if you think of the extra sugar floating in your blood stream as glass shards floating through your blood stream.    Diabetes affects your small vessels first: 1) The glass shards (sugar) scraps down the tiny blood vessels in your eyes and  lead to diabetic retinopathy, the leading cause of blindness in the [place]. Diabetes is the leading cause of newly diagnosed adult (18 to 58 years of age) blindness in the [place].  2) The glass shards scratches down the tiny vessels of your legs leading to nerve damage called neuropathy and can lead to amputations of your feet. More than 60% of all non-traumatic amputations of lower limbs occur in people with diabetes.  3) Over time the small vessels in your brain are shredded and closed off, individually this does not cause any problems but over a long period of time many of the small vessels being blocked can lead to Vascular Dementia.   4) Your kidney's are a filter system and have a "net" that keeps certain things in the body and lets bad things out. Sugar shreds this net and leads to kidney damage and eventually failure. Decreasing the sugar that is destroying the net and certain blood pressure medications can help stop or decrease progression of kidney disease. Diabetes was the primary cause of kidney failure in 44 percent of all new cases in 2011.  5) Diabetes also destroys the small vessels in your penis that lead to erectile dysfunction. Eventually the vessels are so damaged that you may not be responsive to cialis or viagra.   Diabetes and your large vessels: Your larger vessels consist of your coronary arteries in your heart and the carotid vessels to your brain. Diabetes or  even increased sugars put you at 300% increased risk of heart attack and stroke and this is why.. The sugar scrapes down your large blood vessels and your body sees this as an internal injury and tries to repair itself. Just like you get a scab on your skin, your platelets will stick to the blood vessel wall trying to heal it. This is why we have diabetics on low dose aspirin daily, this prevents the platelets from sticking and can prevent plaque formation. In addition, your body takes cholesterol and tries to shove it into the open wound. This is why we want your LDL, or bad cholesterol, below 70.   The combination of platelets and cholesterol over 5-10 years forms plaque that can break off and cause a heart attack or stroke.   PLEASE REMEMBER:  Diabetes is preventable! Up to 50 percent of complications and morbidities among individuals with type 2 diabetes can be prevented, delayed, or effectively treated and minimized with regular visits to a health professional, appropriate monitoring and medication, and a healthy diet and lifestyle.

## 2014-07-04 LAB — URINALYSIS, ROUTINE W REFLEX MICROSCOPIC
BILIRUBIN URINE: NEGATIVE
Glucose, UA: NEGATIVE mg/dL
HGB URINE DIPSTICK: NEGATIVE
KETONES UR: NEGATIVE mg/dL
Leukocytes, UA: NEGATIVE
NITRITE: NEGATIVE
Protein, ur: NEGATIVE mg/dL
Specific Gravity, Urine: 1.025 (ref 1.005–1.030)
UROBILINOGEN UA: 0.2 mg/dL (ref 0.0–1.0)
pH: 7 (ref 5.0–8.0)

## 2014-07-04 LAB — VITAMIN D 25 HYDROXY (VIT D DEFICIENCY, FRACTURES): Vit D, 25-Hydroxy: 48 ng/mL (ref 30–100)

## 2014-07-04 LAB — MICROALBUMIN / CREATININE URINE RATIO
CREATININE, URINE: 174.7 mg/dL
MICROALB/CREAT RATIO: 2.9 mg/g (ref 0.0–30.0)
Microalb, Ur: 0.5 mg/dL (ref <2.0)

## 2014-07-04 LAB — TSH: TSH: 1.199 u[IU]/mL (ref 0.350–4.500)

## 2014-08-12 ENCOUNTER — Encounter: Payer: Self-pay | Admitting: *Deleted

## 2014-08-22 ENCOUNTER — Telehealth: Payer: Self-pay | Admitting: Physician Assistant

## 2014-08-22 MED ORDER — PREDNISONE 20 MG PO TABS: ORAL_TABLET | ORAL | Status: DC

## 2014-08-22 NOTE — Telephone Encounter (Signed)
Patient called complained of sore throat for 2 days and is going out of town to see her kids. Denies any other symptoms like fever, chills, sinus issues.  Will add presdnisone 20mg  #5, make sure on allergy pill and if not better can send in ABX since only 2 days and no other accompaniments does not need ABX.   HOW TO TREAT VIRAL COUGH AND COLD SYMPTOMS:  -Symptoms usually last at least 1 week with the worst symptoms being around day 4.  - colds usually start with a sore throat and end with a cough, and the cough can take 2 weeks to get better.  -No antibiotics are needed for colds, flu, sore throats, cough, bronchitis UNLESS symptoms are longer than 7 days OR if you are getting better then get drastically worse.  -There are a lot of combination medications (Dayquil, Nyquil, Vicks 44, tyelnol cold and sinus, ETC). Please look at the ingredients on the back so that you are treating the correct symptoms and not doubling up on medications/ingredients.    Medicines you can use  Sore Throat  -Acetaminophen (Tyelnol) -Ibuprofen (Advil, motrin, aleve) -Drink a lot of water -Gargle with salt water - Rest your voice (don't talk) -Throat sprays -Cough drops  Allergy symptoms (cough, sneeze, runny nose, itchy eyes) -Claritin or loratadine cheapest but likely the weakest  -Zyrtec or certizine at night because it can make you sleepy -The strongest is allegra or fexafinadine  Cheapest at [employer], [employer], [employer]  Cough  -Dextromethorphan (Delsym)- medicine that has DM in it -Guafenesin (Mucinex/Robitussin) - cough drops - drink lots of water   General health when sick  -Hydration -wash your hands frequently -keep surfaces clean -change pillow cases and sheets often -Get fresh air but do not exercise strenuously -Vitamin D, double up on it - Vitamin C -Zinc

## 2014-08-23 ENCOUNTER — Ambulatory Visit (INDEPENDENT_AMBULATORY_CARE_PROVIDER_SITE_OTHER): Payer: BLUE CROSS/BLUE SHIELD | Admitting: Physician Assistant

## 2014-08-23 ENCOUNTER — Encounter: Payer: Self-pay | Admitting: Physician Assistant

## 2014-08-23 VITALS — BP 110/72 | HR 76 | Temp 97.7°F | Resp 16 | Ht 68.75 in | Wt 177.0 lb

## 2014-08-23 DIAGNOSIS — J029 Acute pharyngitis, unspecified: Secondary | ICD-10-CM

## 2014-08-23 MED ORDER — PREDNISONE 20 MG PO TABS: ORAL_TABLET | ORAL | Status: DC

## 2014-08-23 MED ORDER — AZITHROMYCIN 250 MG PO TABS: ORAL_TABLET | ORAL | Status: AC

## 2014-08-23 NOTE — Progress Notes (Signed)
   Subjective:    Patient ID: Colleen MurrainNancy K Straus, female    DOB: January 03, 1956, 59 y.o.   MRN: [id number]  HPI 59 y.o. female complains of symptoms of a URI. She had acute onset Saturday with fatigue and muscle aches, felt feverish, decreased appetite, and then coughing night and AM, sore throat. Has increased her supplements, increase fluids and taking tylenol. Treatment to date: none. She will be going to a wedding around little kids and her mom.   Blood pressure 110/72, pulse 76, temperature 97.7 F (36.5 C), resp. rate 16, height 5' 8.75" (1.746 m), weight 177 lb (80.287 kg).  Current Outpatient Prescriptions on File Prior to Visit  Medication Sig Dispense Refill  . B Complex Vitamins (B COMPLEX PO) Take by mouth daily.    . Cholecalciferol (VITAMIN D PO) Take 3,600 Int'l Units by mouth daily.    . Nutritional Supplements (GLA 250 PO) Take by mouth.    Marland Kitchen. OVER THE COUNTER MEDICATION daily. VITALIZER STRIP    . OVER THE COUNTER MEDICATION daily. NUTRIFERON    . OVER THE COUNTER MEDICATION daily. OSTEOMATRIX    . OVER THE COUNTER MEDICATION daily. MENOPAUSE BALANCING COMPLEX    . OVER THE COUNTER MEDICATION daily. MINDWORKS    . OVER THE COUNTER MEDICATION daily. SOY PROTEIN DRINK IN A.M.    . sertraline (ZOLOFT) 50 MG tablet Take 1 tablet (50 mg total) by mouth daily. 90 tablet 3   No current facility-administered medications on file prior to visit.   Past Medical History  Diagnosis Date  . Anxiety   . Depression   . Allergy   . Fibrocystic breast disease   . Endometriosis   . Vitamin D deficiency    Review of Systems  Constitutional: Positive for fatigue. Negative for fever, chills and diaphoresis.  HENT: Positive for congestion, ear pain, sinus pressure and sore throat. Negative for sneezing.   Respiratory: Negative.  Negative for cough and shortness of breath.   Cardiovascular: Negative.   Musculoskeletal: Negative for neck pain.  Neurological: Negative.  Negative for headaches.       Objective:   Physical Exam  Constitutional: She appears well-developed and well-nourished.  HENT:  Head: Normocephalic and atraumatic.  Right Ear: External ear normal.  Left Ear: External ear normal.  Mouth/Throat: Uvula is midline and mucous membranes are normal. Posterior oropharyngeal edema and posterior oropharyngeal erythema present.  Eyes: Conjunctivae and EOM are normal. Pupils are equal, round, and reactive to light.  Neck: Normal range of motion. Neck supple.  Cardiovascular: Normal rate, regular rhythm and normal heart sounds.   Pulmonary/Chest: Effort normal and breath sounds normal.  Abdominal: Soft. Bowel sounds are normal.  Lymphadenopathy:    She has cervical adenopathy.  Skin: Skin is warm and dry.      Assessment & Plan:  Pharyngitis: take medicationss as prescribed, increase fluids,  Salt water gargles. If symptoms do not improve in 5-7 days or get worse contact the office or go to ER. Will hold the zpak and take if she is not getting better, increase fluids, rest, cont allergy pill

## 2014-08-23 NOTE — Patient Instructions (Addendum)
Please take the prednisone to help decrease inflammation and therefore decrease symptoms, can do 1/2 pill. Take it it with food to avoid GI upset. It can cause increased energy but on the other hand it can make it hard to sleep at night so please take it AT NIGHT WITH DINNER, it takes 8-12 hours to start working so it will NOT affect your sleeping if you take it at night with your food!!  If you are diabetic it will increase your sugars so decrease carbs and monitor your sugars closely.    -Make sure you are drinking plenty of fluids to stay hydrated.  -while drinking fluids pinch and hold nose close and swallow, to help open eustachian tubes and drain fluid behind ear drums. -you can do salt water gargles. You can also do 1 TSP liquid Maalox and 1 TSP liquid benadryl- mix/ gargle/ spit  If you are not feeling better in 10-14 days, then please call the office.  Pharyngitis Pharyngitis is redness, pain, and swelling (inflammation) of your pharynx.  CAUSES  Pharyngitis is usually caused by infection. Most of the time, these infections are from viruses (viral) and are part of a cold. However, sometimes pharyngitis is caused by bacteria (bacterial). Pharyngitis can also be caused by allergies. Viral pharyngitis may be spread from person to person by coughing, sneezing, and personal items or utensils (cups, forks, spoons, toothbrushes). Bacterial pharyngitis may be spread from person to person by more intimate contact, such as kissing.  SIGNS AND SYMPTOMS  Symptoms of pharyngitis include:   Sore throat.   Tiredness (fatigue).   Low-grade fever.   Headache.  Joint pain and muscle aches.  Skin rashes.  Swollen lymph nodes.  Plaque-like film on throat or tonsils (often seen with bacterial pharyngitis). DIAGNOSIS  Your health care provider will ask you questions about your illness and your symptoms. Your medical history, along with a physical exam, is often all that is needed to  diagnose pharyngitis. Sometimes, a rapid strep test is done. Other lab tests may also be done, depending on the suspected cause.  TREATMENT  Viral pharyngitis will usually get better in 3-4 days without the use of medicine. Bacterial pharyngitis is treated with medicines that kill germs (antibiotics).  HOME CARE INSTRUCTIONS   Drink enough water and fluids to keep your urine clear or pale yellow.   Only take over-the-counter or prescription medicines as directed by your health care provider:   If you are prescribed antibiotics, make sure you finish them even if you start to feel better.   Do not take aspirin.   Get lots of rest.   Gargle with 8 oz of salt water ( tsp of salt per 1 qt of water) as often as every 1-2 hours to soothe your throat.   Throat lozenges (if you are not at risk for choking) or sprays may be used to soothe your throat. SEEK MEDICAL CARE IF:   You have large, tender lumps in your neck.  You have a rash.  You cough up green, yellow-brown, or bloody spit. SEEK IMMEDIATE MEDICAL CARE IF:   Your neck becomes stiff.  You drool or are unable to swallow liquids.  You vomit or are unable to keep medicines or liquids down.  You have severe pain that does not go away with the use of recommended medicines.  You have trouble breathing (not caused by a stuffy nose). MAKE SURE YOU:   Understand these instructions.  Will watch your condition.  Will  get help right away if you are not doing well or get worse. Document Released: 03/17/2005 Document Revised: 01/05/2013 Document Reviewed: 11/22/2012 [facility] Patient Information 2015 [place], [place]. This information is not intended to replace advice given to you by your health care provider. Make sure you discuss any questions you have with your health care provider.

## 2014-09-05 ENCOUNTER — Encounter: Payer: Self-pay | Admitting: Family Medicine

## 2014-09-05 ENCOUNTER — Other Ambulatory Visit (INDEPENDENT_AMBULATORY_CARE_PROVIDER_SITE_OTHER): Payer: BLUE CROSS/BLUE SHIELD

## 2014-09-05 ENCOUNTER — Ambulatory Visit (INDEPENDENT_AMBULATORY_CARE_PROVIDER_SITE_OTHER): Payer: BLUE CROSS/BLUE SHIELD | Admitting: Family Medicine

## 2014-09-05 VITALS — BP 116/68 | HR 84 | Ht 68.75 in | Wt 172.0 lb

## 2014-09-05 DIAGNOSIS — M25562 Pain in left knee: Secondary | ICD-10-CM

## 2014-09-05 DIAGNOSIS — M76892 Other specified enthesopathies of left lower limb, excluding foot: Secondary | ICD-10-CM

## 2014-09-05 DIAGNOSIS — M65852 Other synovitis and tenosynovitis, left thigh: Secondary | ICD-10-CM | POA: Diagnosis not present

## 2014-09-05 MED ORDER — DICLOFENAC SODIUM 2 % TD SOLN: TRANSDERMAL | Status: DC

## 2014-09-05 NOTE — Assessment & Plan Note (Signed)
Patient does have what appears to be more of a lateral hamstring tendinitis near the insertion on the fibular head. We discussed different treatment options and patient has elected try conservative therapy. Patient is going to compression, icing, topical anti-inflammatory's, as well as some over-the-counter natural supplementation. Patient will avoid certain activities and we did discuss proper shoe wear. Patient did work with Event organiserathletic trainer to learn home exercises in greater detail. Patient and will come back and see me again in 3 weeks to make sure that she is responding to conservative therapy.

## 2014-09-05 NOTE — Progress Notes (Signed)
Pre visit review using our clinic review tool, if applicable. No additional management support is needed unless otherwise documented below in the visit note. 

## 2014-09-05 NOTE — Progress Notes (Signed)
Tawana ScaleZach Rosamond Andress D.O. [facility] [address] [place], [place] [address] Phone: [phone] Subjective:    I'm seeing this patient by the request  of:  MCKEOWN,WILLIAM DAVID, MD   CC: Left leg pain  WGN:[id number]:Subjective Colleen Alexander is a 59 y.o. female coming in with complaint of left leg pain.  Patient states that she has had this pain intermittently for approximately 9 months. 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 Patient states since she stopped doing the class and she started feeling better. Patient went to a wedding and at the wedding she had severe pain again. Seems to be on the lateral aspect of the knee. Can be associated with some mild numbness.   Past Medical History  Diagnosis Date  . Anxiety   . Depression   . Allergy   . Fibrocystic breast disease   . Endometriosis   . Vitamin D deficiency    Past Surgical History  Procedure Laterality Date  . Lasik    . Laparotomy      for endometriosis   History  Substance Use Topics  . Smoking status: Never Smoker   . Smokeless tobacco: Never Used  . Alcohol Use: No   Allergies  Allergen Reactions  . Sulfa Antibiotics    Family History  Problem Relation Age of Onset  . Hypertension Mother   . Hypertension Father   . Heart disease Father   . COPD Father   . Diabetes Father        Past medical history, social, surgical and family history all reviewed in electronic medical record.   Review of Systems: No headache, visual changes, nausea, vomiting, diarrhea, constipation, dizziness, abdominal pain, skin rash, fevers, chills, night sweats, weight loss, swollen lymph nodes, body aches, joint swelling, muscle aches, chest pain, shortness of breath, mood changes.   Objective Blood pressure 116/68, pulse 84, height 5' 8.75" (1.746 m), weight 172 lb (78.019 kg), SpO2 99 %.  General: No apparent distress alert and oriented x3 mood and affect normal, dressed appropriately.  HEENT: Pupils equal,  extraocular movements intact  Respiratory: Patient's speak in full sentences and does not appear short of breath  Cardiovascular: No lower extremity edema, non tender, no erythema  Skin: Warm dry intact with no signs of infection or rash on extremities or on axial skeleton.  Abdomen: Soft nontender  Neuro: Cranial nerves II through XII are intact, neurovascularly intact in all extremities with 2+ DTRs and 2+ pulses.  Lymph: No lymphadenopathy of posterior or anterior cervical chain or axillae bilaterally.  Gait normal with good balance and coordination.  MSK:  Non tender with full range of motion and good stability and symmetric strength and tone of shoulders, elbows, wrist, hip, and ankles bilaterally.  Knee: Left Normal to inspection with no erythema or effusion or obvious bony abnormalities. Tender to palpation more over the lateral aspect of the fibular head. This distal and inferior to this area. No masses palpated ROM full in flexion and extension and lower leg rotation. Ligaments with solid consistent endpoints including ACL, PCL, LCL, MCL. Negative Mcmurray's, Apley's, and Thessalonian tests. Mild painful patellar compression Patellar glide with minimal crepitus. Patellar and quadriceps tendons unremarkable. Patient does have some discomfort with full extension as well as full flexion on the lateral aspect of the hamstrings. Mild weakness compared to the counter lateral side Contralateral side unremarkable  MSK US performed of: Left knee This study was ordered, performed, and interpreted by Terrilee FilesZach Lothar Prehn D.O.  Knee: All structures visualized. 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 Patellar Tendon unremarkable on long and transverse views without effusion. No abnormality of prepatellar bursa. LCL and MCL unremarkable on long and transverse views. Patient does have hypoechoic  changes at the insertion of the distal's hamstring near the fibular head. No true tear appreciated. Mild increase in Doppler flow but significant hypoechoic changes. No significant bony abnormality noted.  IMPRESSION:  Distal hamstring tendinitis  Procedure note 97110; 15 minutes spent for Therapeutic exercises as stated in above notes.  This included exercises focusing on stretching, strengthening, with significant focus on eccentric aspects.  Basic range of motion exercises to allow proper full motion at ankle Stretching of the lower leg and hamstrings  Theraband exercises for the lower leg - inversion, eversion, dorsiflexion and plantarflexion each to be completed with a theraband Balance exercises to increase proprioception Weight bearing exercises to increase strength and balance  Proper technique shown and discussed handout in great detail with ATC.  All questions were discussed and answered.      Impression and Recommendations:     This case required medical decision making of moderate complexity.

## 2014-09-05 NOTE — Patient Instructions (Signed)
Good to see you.  Ice 20 minutes 2 times daily. Usually after activity and before bed. Exercises 3 times a week.  Exercises on wall.  Heel and butt touching.  Raise leg 6 inches and hold 2 seconds.  Down slow for count of 4 seconds.  1 set of 30 reps daily on both sides.  Compression sleeve ([email or web address] or Dicks) Turmeric 500mg  twice daily Fish oil 2 grams daily pennsaid pinkie amount topically 2 times daily as needed.  See me again in 3 weeks.

## 2014-09-26 ENCOUNTER — Ambulatory Visit (INDEPENDENT_AMBULATORY_CARE_PROVIDER_SITE_OTHER): Payer: BLUE CROSS/BLUE SHIELD | Admitting: Family Medicine

## 2014-09-26 ENCOUNTER — Ambulatory Visit (INDEPENDENT_AMBULATORY_CARE_PROVIDER_SITE_OTHER)
Admission: RE | Admit: 2014-09-26 | Discharge: 2014-09-26 | Disposition: A | Discharge status: Home / Self Care | Payer: BLUE CROSS/BLUE SHIELD | Source: Ambulatory Visit | Attending: Family Medicine | Admitting: Family Medicine

## 2014-09-26 ENCOUNTER — Encounter: Payer: Self-pay | Admitting: Family Medicine

## 2014-09-26 VITALS — BP 128/70 | HR 80 | Ht 68.75 in | Wt 173.0 lb

## 2014-09-26 DIAGNOSIS — M25562 Pain in left knee: Secondary | ICD-10-CM | POA: Diagnosis not present

## 2014-09-26 DIAGNOSIS — M65852 Other synovitis and tenosynovitis, left thigh: Secondary | ICD-10-CM

## 2014-09-26 DIAGNOSIS — M76892 Other specified enthesopathies of left lower limb, excluding foot: Secondary | ICD-10-CM

## 2014-09-26 MED ORDER — MELOXICAM 15 MG PO TABS: 15.0000 mg | ORAL_TABLET | Freq: Every day | ORAL | Status: DC

## 2014-09-26 NOTE — Patient Instructions (Addendum)
Good to see you OK to walk up to 3 times a week. Shorten your stride  New exercises 3 times a week.  Ice the area after walking.  meloxicam daily for 10 days then as needed Continue the vitamins continue the compression for now as well.  Xray downstairs See me again in 3 weeks.  Popliteus Tendinitis with Rehab Tendonitis is a condition that is characterized by inflammation of a tendon. A tendon is the soft tissue that connects muscles to the skeletal system allowing for body movements. Popliteus tendonitis affects the popliteus tendon, which connects the popliteus muscle to the thigh bone (femur) near the knee. The popliteus muscle helps bend and rotate the knee. Popliteus tendonitis is often caused by a tendon tear (strain). Strains are classified into three categories. Grade 1 strains cause pain, but the tendon is not lengthened. Grade 2 strains include a lengthened ligament due to the ligament being stretched or partially ruptured. With grade 2 strains there is still function, although the function may be diminished. Grade 3 strains are characterized by a complete tear of the tendon or muscle, and function is usually impaired. SYMPTOMS   Pain in the knee, specifically the outer (lateral) and back (posterior) portions.  Pain that worsens with use of the popliteus muscle (standing on a slightly bent knee or rotating the knee).  A crackling sound (crepitation) when the tendon is moved or touched (uncommon, except when tested just after exercising). CAUSES Popliteus tendonitis occurs when damage to the popliteus tendon elicits an inflammatory (healing) response. Popliteus tendonitis is often an overuse injury. RISK INCREASES WITH:  Activities that require extensive running or walking downhill.  Poor strength and flexibility.  Failure to warm-up properly before activity.  Flat feet. PREVENTION  Warm up and stretch properly before activity.  Allow for adequate recovery between  workouts.  Maintain physical fitness:  Strength, flexibility, and endurance.  Cardiovascular fitness.  Learn and implement proper training regimens and sports technique.  Arch supports (orthotics) for individuals with flat feet. PROGNOSIS  If treated properly, then the symptoms of popliteus tendonitis usually resolve within 6 weeks.  RELATED COMPLICATIONS   Prolonged healing time, if improperly treated or re-injured.  Recurrent symptoms that result in a chronic problem. TREATMENT  Treatment initially involves the use of ice and medication to help reduce pain and inflammation. The use of strengthening and stretching exercises may help reduce pain with activity. These exercises may be performed at home or with referral to a therapist. Many individuals find that the use of a compression bandage or a knee sleeve helps reduce symptoms. If you have flat feet, then your caregiver may recommend arch supports. It is important to learn/modify techniques for running uphill/downhill that do not aggravate your symptoms. If symptoms persist for greater than 6 months despite conservative (non-surgical) treatment, then surgery may be recommended to remove the tendon sheath (lining).  MEDICATION   If pain medication is necessary, then nonsteroidal anti-inflammatory medications, such as aspirin and ibuprofen, or other minor pain relievers, such as acetaminophen, are often recommended.  Do not take pain medication within 7 days before surgery.  Prescription pain relievers may be given if deemed necessary by your caregiver. Use only as directed and only as much as you need. HEAT AND COLD  Cold treatment (icing) relieves pain and reduces inflammation. Cold treatment should be applied for 10 to 15 minutes every 2 to 3 hours for inflammation and pain and immediately after any activity that aggravates your symptoms. Use ice  packs or massage the area with a piece of ice (ice massage).  Heat treatment may be  used prior to performing the stretching and strengthening activities prescribed by your caregiver, physical therapist, or athletic trainer. Use a heat pack or soak the injury in warm water. SEEK MEDICAL CARE IF:  Treatment seems to offer no benefit, or the condition worsens.  Any medications produce adverse side effects. EXERCISES RANGE OF MOTION (ROM) AND STRETCHING EXERCISES - Popliteus Tendinitis These exercises may help you when beginning to rehabilitate your injury. Your symptoms may resolve with or without further involvement from your physician, physical therapist or athletic trainer. While completing these exercises, remember:   Restoring tissue flexibility helps normal motion to return to the joints. This allows healthier, less painful movement and activity.  An effective stretch should be held for at least 30 seconds.  A stretch should never be painful. You should only feel a gentle lengthening or release in the stretched tissue. STRETCH - Gastroc, Standing   Place hands on wall.  Extend right / left leg, keeping the front knee somewhat bent.  Slightly point your toes inward on your back foot.  Keeping your right / left heel on the floor and your knee straight, shift your weight toward the wall, not allowing your back to arch.  You should feel a gentle stretch in the right / left calf. Hold this position for __________ seconds. Repeat __________ times. Complete this stretch __________ times per day. STRETCH - Soleus, Standing   Place hands on wall.  Extend right / left leg, keeping the other knee somewhat bent.  Slightly point your toes inward on your back foot.  Keep your right / left heel on the floor, bend your back knee, and slightly shift your weight over the back leg so that you feel a gentle stretch deep in your back calf.  Hold this position for __________ seconds. Repeat __________ times. Complete this stretch __________ times per day. STRETCH -  Gastrocsoleus, Standing  Note: This exercise can place a lot of stress on your foot and ankle. Please complete this exercise only if specifically instructed by your caregiver.   Place the ball of your right / left foot on a step, keeping your other foot firmly on the same step.  Hold on to the wall or a rail for balance.  Slowly lift your other foot, allowing your body weight to press your heel down over the edge of the step.  You should feel a stretch in your right / left calf.  Hold this position for __________ seconds.  Repeat this exercise with a slight bend in your right / left knee. Repeat __________ times. Complete this stretch __________ times per day.  STRETCH - Hamstrings, Standing  Stand or sit and extend your right / left leg, placing your foot on a chair or foot stool  Keeping a slight arch in your low back and your hips straight forward.  Lead with your chest and lean forward at the waist until you feel a gentle stretch in the back of your right / left knee or thigh. (When done correctly, this exercise requires leaning only a small distance.)  Hold this position for __________ seconds. Repeat __________ times. Complete this stretch __________ times per day. STRETCH - Hamstrings, Supine   Lie on your back. Loop a belt or towel over the ball of your right / left foot.  Straighten your right / left knee and slowly pull on the belt to raise  your leg. Do not allow the right / left knee to bend. Keep your opposite leg flat on the floor.  Raise the leg until you feel a gentle stretch behind your right / left knee or thigh. Hold this position for __________ seconds. Repeat __________ times. Complete this stretch __________ times per day.  STRETCH - Hamstrings, Doorway  Lie on your back with your right / left leg extended and resting on the wall and the opposite leg flat on the ground through the door. Initially, position your bottom farther away from the wall than the  illustration shows.  Keep your right / left knee straight. If you feel a stretch behind your knee or thigh, hold this position for __________ seconds.  If you do not feel a stretch, scoot your bottom closer to the door, and hold __________ seconds. Repeat __________ times. Complete this stretch __________ times per day.  STRETCH - Quadriceps, Prone   Lie on your stomach on a firm surface, such as a bed or padded floor.  Bend your right / left knee and grasp your ankle. If you are unable to reach, your ankle or pant leg, use a belt around your foot to lengthen your reach.  Gently pull your heel toward your buttocks. Your knee should not slide out to the side. You should feel a stretch in the front of your thigh and/or knee.  Hold this position for __________ seconds. Repeat __________ times. Complete this stretch __________ times per day.  STRENGTHENING EXERCISES - Popliteus Tendinitis These exercises may help you when beginning to rehabilitate your injury. They may resolve your symptoms with or without further involvement from your physician, physical therapist or athletic trainer. While completing these exercises, remember:   Muscles can gain both the endurance and the strength needed for everyday activities through controlled exercises.  Complete these exercises as instructed by your physician, physical therapist or athletic trainer. Progress the resistance and repetitions only as guided. STRENGTH - Hamstring, Isometrics   Lie on your back on a firm surface.  Bend your right / left knee approximately __________ degrees.  Dig your heel into the surface as if you are trying to pull it toward your buttocks. Tighten the muscles in the back of your thighs to "dig" as hard as you can without increasing any pain.  Hold this position for __________ seconds.  Release the tension gradually and allow your muscle to completely relax for __________ seconds in between each exercise. Repeat  __________ times. Complete this exercise __________ times per day.  STRENGTH - Hamstring, Curls   Lay on your stomach with your legs extended. (If you lay on a bed, your feet may hang over the edge.)  Tighten the muscles in the back of your thigh to bend your right / left knee up to 90 degrees. Keep your hips flat on the bed/floor.  Hold this position for __________ seconds.  Slowly lower your leg back to the starting position. Repeat __________ times. Complete this exercise __________ times per day.  OPTIONAL ANKLE WEIGHTS: Begin with ____________________, but DO NOT exceed ____________________. Increase in1 lb/0.5 kg increments. Document Released: 03/17/2005 Document Revised: 06/09/2011 Document Reviewed: 06/29/2008 [facility] Patient Information 2015 [place], [place]. This information is not intended to replace advice given to you by your health care provider. Make sure you discuss any questions you have with your health care provider.

## 2014-09-26 NOTE — Progress Notes (Signed)
Pre visit review using our clinic review tool, if applicable. No additional management support is needed unless otherwise documented below in the visit note. 

## 2014-09-26 NOTE — Assessment & Plan Note (Signed)
Based on findings I do think that this pain is still secondary to the distal hamstring. Encourage patient to do the exercises on a more regular basis as well as the icing protocol. Patient will do compression sleeve which she's been doing intermittently. In addition of this we discussed different exercises more for the popliteal tendon the calcific conjoining the pain in this area. X-rays ordered today to rule out any other bony abnormality that could be contribute in. I do not believe that this is any radicular symptom from her back. Patient continues to have pain unfortunately advance imaging may be necessary. We will discuss further at follow-up in 3 weeks.

## 2014-09-26 NOTE — Progress Notes (Signed)
[facility] [address] [place], [place] [address] Phone: [phone] Subjective:    I'm seeing this patient by the request  of:  MCKEOWN,WILLIAM DAVID, MD   CC: Left leg pain follow-up  [id number] Colleen Alexander is a 59 y.o. female coming in with complaint of left leg pain.  She was seen previously and was diagnosed with more of a distal hamstring tendinitis. Patient was given home exercises, discussed compression, discussed icing. Patient states overall she has made some mild improvement. Patient states that she has days where she seems to be pain-free and other days where it is severe pain. Does not have any rhyme or reason to this. Patient states that unfortunately became stop her from activity. Patient states that the topical anti-inflammatory did not make any significant improvement. Patient still states that it seems to be on the lateral aspect of the knee. The posterior lateral aspect. Denies any foot drop or any weakness of the leg. Started the over-the-counter natural supplementation with mild improvement she would state.  Past Medical History  Diagnosis Date  . Anxiety   . Depression   . Allergy   . Fibrocystic breast disease   . Endometriosis   . Vitamin D deficiency    Past Surgical History  Procedure Laterality Date  . Lasik    . Laparotomy      for endometriosis   History  Substance Use Topics  . Smoking status: Never Smoker   . Smokeless tobacco: Never Used  . Alcohol Use: No   Allergies  Allergen Reactions  . Sulfa Antibiotics    Family History  Problem Relation Age of Onset  . Hypertension Mother   . Hypertension Father   . Heart disease Father   . COPD Father   . Diabetes Father        Past medical history, social, surgical and family history all reviewed in electronic medical record.   Review of Systems: No headache, visual changes, nausea, vomiting, diarrhea, constipation, dizziness, abdominal pain, skin rash,  fevers, chills, night sweats, weight loss, swollen lymph nodes, body aches, joint swelling, muscle aches, chest pain, shortness of breath, mood changes.   Objective Blood pressure 128/70, pulse 80, height 5' 8.75" (1.746 m), weight 173 lb (78.472 kg), SpO2 99 %.  General: No apparent distress alert and oriented x3 mood and affect normal, dressed appropriately.  HEENT: Pupils equal, extraocular movements intact  Respiratory: Patient's speak in full sentences and does not appear short of breath  Cardiovascular: No lower extremity edema, non tender, no erythema  Skin: Warm dry intact with no signs of infection or rash on extremities or on axial skeleton.  Abdomen: Soft nontender  Neuro: Cranial nerves II through XII are intact, neurovascularly intact in all extremities with 2+ DTRs and 2+ pulses.  Lymph: No lymphadenopathy of posterior or anterior cervical chain or axillae bilaterally.  Gait normal with good balance and coordination.  MSK:  Non tender with full range of motion and good stability and symmetric strength and tone of shoulders, elbows, wrist, hip, and ankles bilaterally.  Knee: Left Normal to inspection with no erythema or effusion or obvious bony abnormalities. Tender to palpation more over the lateral aspect of the fibular head. This distal and inferior to this area. No masses palpated ROM full in flexion and extension and lower leg rotation. Ligaments with solid consistent endpoints including ACL, PCL, LCL, MCL. Negative Mcmurray's, Apley's, and Thessalonian tests. Mild painful patellar compression Patellar glide with minimal  crepitus. Patellar and quadriceps tendons unremarkable. Patient does have some discomfort with full extension as well as full flexion on the lateral aspect of the hamstrings. Mild weakness compared to the counter lateral side Contralateral side unremarkable No significant change  MSK US performed of: Left knee This study was ordered, performed, and  interpreted by Terrilee FilesZach Emeree Mahler D.O.  Knee: All structures visualized. 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 Patellar Tendon unremarkable on long and transverse views without effusion. No abnormality of prepatellar bursa. LCL and MCL unremarkable on long and transverse views. Patient does have hypoechoic changes at the insertion of the distal's hamstring near the fibular head. No true tear appreciated. Mild increase in Doppler flow but significant hypoechoic changes present. Does have signs and symptoms of popliteal tendinitis on the lateral joint line as well.. No significant bony abnormality noted.  IMPRESSION:  Distal hamstring tendinitis question popliteal tendinitis       Impression and Recommendations:     This case required medical decision making of moderate complexity.

## 2014-10-04 ENCOUNTER — Other Ambulatory Visit: Payer: Self-pay | Admitting: Internal Medicine

## 2014-10-17 ENCOUNTER — Encounter: Payer: Self-pay | Admitting: Family Medicine

## 2014-10-17 ENCOUNTER — Ambulatory Visit (INDEPENDENT_AMBULATORY_CARE_PROVIDER_SITE_OTHER): Payer: BLUE CROSS/BLUE SHIELD | Admitting: Family Medicine

## 2014-10-17 VITALS — BP 122/82 | HR 67 | Wt 173.0 lb

## 2014-10-17 DIAGNOSIS — M65852 Other synovitis and tenosynovitis, left thigh: Secondary | ICD-10-CM

## 2014-10-17 DIAGNOSIS — M76892 Other specified enthesopathies of left lower limb, excluding foot: Secondary | ICD-10-CM

## 2014-10-17 NOTE — Assessment & Plan Note (Addendum)
Resolved at this time follow-up as needed

## 2014-10-17 NOTE — Patient Instructions (Addendum)
Good to see you Ice 20 minutes 2 times daily. Usually after activity and before bed. meloxicam daily for 5 days when needed OK to do anything See me when you need me :)

## 2014-10-17 NOTE — Progress Notes (Signed)
  [facility] Raheim Beutler D.O. [facility] [address] [place], [place] [address] Phone: [phone] Subjective:    I'm seeing this patient by the request  of:  MCKEOWN,WILLIAM DAVID, MD   CC: Left leg pain follow-up  OZH:[id number]:Subjective Colleen Alexander is a 59 y.o. female coming in with complaint of left leg pain.  Patient does have more of a popliteal tendinitis as well as distal hamstring tendinitis. Patient also did have x-rays that showed moderate osteophytic changes at last follow-up. Patient was to continue conservative therapy with compression, icing, and home exercises. Patient states she is 100% better. Patient is able to do the elliptical as well as dry without any significant difficulty. Patient denies any numbness or tingling. States that she can do all activities she feels fit.  Past Medical History  Diagnosis Date  . Anxiety   . Depression   . Allergy   . Fibrocystic breast disease   . Endometriosis   . Vitamin D deficiency    Past Surgical History  Procedure Laterality Date  . Lasik    . Laparotomy      for endometriosis   History  Substance Use Topics  . Smoking status: Never Smoker   . Smokeless tobacco: Never Used  . Alcohol Use: No   Allergies  Allergen Reactions  . Sulfa Antibiotics    Family History  Problem Relation Age of Onset  . Hypertension Mother   . Hypertension Father   . Heart disease Father   . COPD Father   . Diabetes Father        Past medical history, social, surgical and family history all reviewed in electronic medical record.   Review of Systems: No headache, visual changes, nausea, vomiting, diarrhea, constipation, dizziness, abdominal pain, skin rash, fevers, chills, night sweats, weight loss, swollen lymph nodes, body aches, joint swelling, muscle aches, chest pain, shortness of breath, mood changes.   Objective Blood pressure 122/82, pulse 67, weight 173 lb (78.472 kg), SpO2 98 %.  General: No apparent distress alert and  oriented x3 mood and affect normal, dressed appropriately.  HEENT: Pupils equal, extraocular movements intact  Respiratory: Patient's speak in full sentences and does not appear short of breath  Cardiovascular: No lower extremity edema, non tender, no erythema  Skin: Warm dry intact with no signs of infection or rash on extremities or on axial skeleton.  Abdomen: Soft nontender  Neuro: Cranial nerves II through XII are intact, neurovascularly intact in all extremities with 2+ DTRs and 2+ pulses.  Lymph: No lymphadenopathy of posterior or anterior cervical chain or axillae bilaterally.  Gait normal with good balance and coordination.  MSK:  Non tender with full range of motion and good stability and symmetric strength and tone of shoulders, elbows, wrist, hip, and ankles bilaterally.  Knee: Left Normal to inspection with no erythema or effusion or obvious bony abnormalities. Nontender Ligaments with solid consistent endpoints including ACL, PCL, LCL, MCL. Negative Mcmurray's, Apley's, and Thessalonian tests. Mild painful patellar compression Patellar glide with minimal crepitus. Patellar and quadriceps tendons unremarkable. Patient does have some discomfort with full extension as well as full flexion on the lateral aspect of the hamstrings. Mild weakness compared to the counter lateral side Contralateral side unremarkable No significant change    Impression and Recommendations:     This case required medical decision making of moderate complexity.

## 2015-03-21 ENCOUNTER — Ambulatory Visit (INDEPENDENT_AMBULATORY_CARE_PROVIDER_SITE_OTHER): Payer: BLUE CROSS/BLUE SHIELD | Admitting: Physician Assistant

## 2015-03-21 ENCOUNTER — Encounter: Payer: Self-pay | Admitting: Physician Assistant

## 2015-03-21 VITALS — BP 110/80 | HR 82 | Temp 97.7°F | Resp 16 | Ht 68.75 in | Wt 175.0 lb

## 2015-03-21 DIAGNOSIS — L820 Inflamed seborrheic keratosis: Secondary | ICD-10-CM

## 2015-03-21 NOTE — Progress Notes (Signed)
Chief Complaint: Patient presents for evaluation of skin lesions. Patient has erythematous, scaly lesions on trunk, lower extremity and chest, changing in shape.   Exam: seborrheic keratoses  Procedure Details   The risks, benefits, indications, potential complications, and alternatives were explained to the patient and informed consent obtained.  Liquid nitrogen was use in a 3 freeze and thaw technique. The patient tolerated the procedure well.   Condition: Stable  Complications:  None  Diagnosis: Seb. Dermatitis  Procedure code:  [id number]  Plan: 1. Patient educated that the area will begin to heal in approximately a week.  2. Warning signs of infection were reviewed.    3. Recommended that the patient use OTC acetaminophen as needed for pain.

## 2015-03-21 NOTE — Patient Instructions (Signed)
Seborrheic Keratosis Seborrheic keratosis is a common, noncancerous (benign) skin growth. This condition causes waxy, rough, tan, brown, or black spots to appear on the skin. These skin growths can be flat or raised. CAUSES The cause of this condition is not known. RISK FACTORS This condition is more likely to develop in:  People who have a family history of seborrheic keratosis.  People who are 50 or older.  People who are pregnant.  People who have had estrogen replacement therapy. SYMPTOMS This condition often occurs on the face, chest, shoulders, back, or other areas. These growths:  Are usually painless, but may become irritated and itchy.  Can be yellow, brown, black, or other colors.  Are slightly raised or have a flat surface.  Are sometimes rough or wart-like in texture.  Are often waxy on the surface.  Are round or oval-shaped.  Sometimes look like they are "stuck on."  Often occur in groups, but may occur as a single growth. DIAGNOSIS This condition is diagnosed with a medical history and physical exam. A sample of the growth may be tested (skin biopsy). You may need to see a skin specialist (dermatologist). TREATMENT Treatment is not usually needed for this condition, unless the growths are irritated or are often bleeding. You may also choose to have the growths removed if you do not like their appearance. Most commonly, these growths are treated with a procedure in which liquid nitrogen is applied to "freeze" off the growth (cryosurgery). They may also be burned off with electricity or cut off. HOME CARE INSTRUCTIONS  Watch your growth for any changes.  Keep all follow-up visits as told by your health care provider. This is important.  Do not scratch or pick at the growth or growths. This can cause them to become irritated or infected. SEEK MEDICAL CARE IF:  You suddenly have many new growths.  Your growth bleeds, itches, or hurts.  Your growth suddenly  becomes larger or changes color.   This information is not intended to replace advice given to you by your health care provider. Make sure you discuss any questions you have with your health care provider.   Document Released: 04/19/2010 Document Revised: 12/06/2014 Document Reviewed: 08/02/2014 Elsevier Interactive Patient Education 2016 [employer]  

## 2015-03-23 ENCOUNTER — Other Ambulatory Visit: Payer: Self-pay | Admitting: Internal Medicine

## 2015-07-11 ENCOUNTER — Other Ambulatory Visit (HOSPITAL_COMMUNITY)
Admission: RE | Admit: 2015-07-11 | Discharge: 2015-07-11 | Disposition: A | Discharge status: Home / Self Care | Payer: BLUE CROSS/BLUE SHIELD | Source: Ambulatory Visit | Attending: Physician Assistant | Admitting: Physician Assistant

## 2015-07-11 ENCOUNTER — Encounter: Payer: Self-pay | Admitting: Physician Assistant

## 2015-07-11 ENCOUNTER — Ambulatory Visit (INDEPENDENT_AMBULATORY_CARE_PROVIDER_SITE_OTHER): Payer: BLUE CROSS/BLUE SHIELD | Admitting: Physician Assistant

## 2015-07-11 VITALS — BP 124/70 | HR 72 | Temp 97.3°F | Resp 16 | Ht 69.0 in | Wt 174.6 lb

## 2015-07-11 DIAGNOSIS — N6019 Diffuse cystic mastopathy of unspecified breast: Secondary | ICD-10-CM

## 2015-07-11 DIAGNOSIS — R6889 Other general symptoms and signs: Secondary | ICD-10-CM

## 2015-07-11 DIAGNOSIS — R232 Flushing: Secondary | ICD-10-CM

## 2015-07-11 DIAGNOSIS — E781 Pure hyperglyceridemia: Secondary | ICD-10-CM

## 2015-07-11 DIAGNOSIS — Z0001 Encounter for general adult medical examination with abnormal findings: Secondary | ICD-10-CM

## 2015-07-11 DIAGNOSIS — E559 Vitamin D deficiency, unspecified: Secondary | ICD-10-CM | POA: Diagnosis not present

## 2015-07-11 DIAGNOSIS — Z136 Encounter for screening for cardiovascular disorders: Secondary | ICD-10-CM | POA: Diagnosis not present

## 2015-07-11 DIAGNOSIS — Z124 Encounter for screening for malignant neoplasm of cervix: Secondary | ICD-10-CM

## 2015-07-11 DIAGNOSIS — Z79899 Other long term (current) drug therapy: Secondary | ICD-10-CM | POA: Diagnosis not present

## 2015-07-11 DIAGNOSIS — Z01419 Encounter for gynecological examination (general) (routine) without abnormal findings: Secondary | ICD-10-CM | POA: Diagnosis present

## 2015-07-11 DIAGNOSIS — N951 Menopausal and female climacteric states: Secondary | ICD-10-CM

## 2015-07-11 DIAGNOSIS — Z1151 Encounter for screening for human papillomavirus (HPV): Secondary | ICD-10-CM | POA: Insufficient documentation

## 2015-07-11 DIAGNOSIS — F419 Anxiety disorder, unspecified: Secondary | ICD-10-CM

## 2015-07-11 DIAGNOSIS — T7840XD Allergy, unspecified, subsequent encounter: Secondary | ICD-10-CM

## 2015-07-11 DIAGNOSIS — Z1389 Encounter for screening for other disorder: Secondary | ICD-10-CM

## 2015-07-11 LAB — BASIC METABOLIC PANEL WITH GFR
BUN: 21 mg/dL (ref 7–25)
CHLORIDE: 103 mmol/L (ref 98–110)
CO2: 30 mmol/L (ref 20–31)
Calcium: 9.5 mg/dL (ref 8.6–10.4)
Creat: 0.78 mg/dL (ref 0.50–1.05)
GFR, EST NON AFRICAN AMERICAN: 84 mL/min (ref >=60)
GFR, Est African American: >89 mL/min (ref >=60)
Glucose, Bld: 77 mg/dL (ref 65–99)
POTASSIUM: 4.1 mmol/L (ref 3.5–5.3)
SODIUM: 140 mmol/L (ref 135–146)

## 2015-07-11 LAB — LIPID PANEL
Cholesterol: 197 mg/dL (ref 125–200)
HDL: 62 mg/dL (ref >=46)
LDL Cholesterol: 108 mg/dL (ref <130)
TRIGLYCERIDES: 135 mg/dL (ref <150)
Total CHOL/HDL Ratio: 3.2 Ratio (ref <=5.0)
VLDL: 27 mg/dL (ref <30)

## 2015-07-11 LAB — HEPATIC FUNCTION PANEL
ALBUMIN: 4.2 g/dL (ref 3.6–5.1)
ALT: 16 U/L (ref 6–29)
AST: 23 U/L (ref 10–35)
Alkaline Phosphatase: 56 U/L (ref 33–130)
BILIRUBIN DIRECT: 0.1 mg/dL (ref <=0.2)
BILIRUBIN INDIRECT: 0.4 mg/dL (ref 0.2–1.2)
BILIRUBIN TOTAL: 0.5 mg/dL (ref 0.2–1.2)
TOTAL PROTEIN: 6.7 g/dL (ref 6.1–8.1)

## 2015-07-11 LAB — CBC WITH DIFFERENTIAL/PLATELET
BASOS PCT: 1 %
Basophils Absolute: 59 cells/uL (ref 0–200)
EOS ABS: 118 {cells}/uL (ref 15–500)
Eosinophils Relative: 2 %
HCT: 40.6 % (ref 35.0–45.0)
Hemoglobin: 13.2 g/dL (ref 11.7–15.5)
Lymphocytes Relative: 26 %
Lymphs Abs: 1534 cells/uL (ref 850–3900)
MCH: 28.5 pg (ref 27.0–33.0)
MCHC: 32.5 g/dL (ref 32.0–36.0)
MCV: 87.7 fL (ref 80.0–100.0)
MONOS PCT: 8 %
MPV: 10.8 fL (ref 7.5–12.5)
Monocytes Absolute: 472 cells/uL (ref 200–950)
Neutro Abs: 3717 cells/uL (ref 1500–7800)
Neutrophils Relative %: 63 %
PLATELETS: 226 10*3/uL (ref 140–400)
RBC: 4.63 MIL/uL (ref 3.80–5.10)
RDW: 12.8 % (ref 11.0–15.0)
WBC: 5.9 10*3/uL (ref 3.8–10.8)

## 2015-07-11 LAB — MAGNESIUM: MAGNESIUM: 1.9 mg/dL (ref 1.5–2.5)

## 2015-07-11 NOTE — Progress Notes (Signed)
Complete Physical  Assessment and Plan: 1. Anxiety continue medications, stress management techniques discussed, increase water, good sleep hygiene discussed, increase exercise, and increase veggies.  - TSH - sertraline (ZOLOFT) 50 MG tablet; Take 1 tablet (50 mg total) by mouth daily.  Dispense: 90 tablet; Refill: 3  2. Depression - sertraline (ZOLOFT) 50 MG tablet; Take 1 tablet (50 mg total) by mouth daily.  Dispense: 90 tablet; Refill: 3  3. Vitamin D deficiency Continue supplement - Vit D  25 hydroxy (rtn osteoporosis monitoring)  4. Allergy, subsequent encounter - Allegra OTC, increase H20, allergy hygiene explained.  5. Fibrocystic breast disease, unspecified laterality Self breast exams, get MGM 3D  6. Hypertriglyceridemia -continue medications, check lipids, decrease fatty foods, increase activity.  - Lipid panel - Microalbumin / creatinine urine ratio - Urinalysis, Routine w reflex microscopic  7. Encounter for general adult medical examination with abnormal findings PAP done today.   8. Medication management - CBC with Differential/Platelet - BASIC METABOLIC PANEL WITH GFR - Hepatic function panel - Magnesium  Health Maintenance Discussed med's effects and SE's. Screening labs and tests as requested with regular follow-up as recommended.  HPI 60 y.o. female  presents for a complete physical. Her blood pressure has been controlled at home, today their BP is BP: 124/70 mmHg She does workout, [facility]. Hurt knee with pure barre and is on mobic occ.  She denies chest pain, shortness of breath, dizziness.  She is not on cholesterol medication and denies myalgias. Her cholesterol is at goal. The cholesterol last visit was:   Lab Results  Component Value Date   CHOL 198 07/03/2014   HDL 66 07/03/2014   LDLCALC 114* 07/03/2014   TRIG 90 07/03/2014   CHOLHDL 3.0 07/03/2014   Patient is on Vitamin D supplement, Vitamin D 60 She is on zoloft for  depression/anxiety which helps.  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  Her husband has lost his mom and had some deaths in the family.   Current Medications:  Current Outpatient Prescriptions on File Prior to Visit  Medication Sig Dispense Refill  . B Complex Vitamins (B COMPLEX PO) Take by mouth daily.    . Cholecalciferol (VITAMIN D PO) Take 3,600 Int'l Units by mouth daily.    . Nutritional Supplements (GLA 250 PO) Take by mouth.    Marland Kitchen. OVER THE COUNTER MEDICATION daily. VITALIZER STRIP    . OVER THE COUNTER MEDICATION daily. NUTRIFERON    . OVER THE COUNTER MEDICATION daily. OSTEOMATRIX    . OVER THE COUNTER MEDICATION daily. MENOPAUSE BALANCING COMPLEX    . OVER THE COUNTER MEDICATION daily. MINDWORKS    . OVER THE COUNTER MEDICATION daily. SOY PROTEIN DRINK IN A.M.    . sertraline (ZOLOFT) 50 MG tablet TAKE 1 TABLET (50 MG TOTAL) BY MOUTH DAILY. 90 tablet 1   No current facility-administered medications on file prior to visit.   Health Maintenance:   Immunization History  Administered Date(s) Administered  . Tdap 06/27/2013   Tetanus: 2015 Pneumovax: N/A Prevnar 13: N/A Flu vaccine: Declines Zostavax: N/A No LMP recorded. Patient is postmenopausal. Jan 2015 Pap: 2014, never abnormal pap, due 2017 3DMGM: 05/2015 at [facility], following right breast DEXA: N/A, no fractures, non smoker, sister osteopenia, mom older with osteoporosis.  Colonoscopy: 2008 due 2018, Dr. Randa EvensEdwards EGD: N/A  Allergies:  Allergies  Allergen Reactions  . Sulfa Antibiotics    Medical History:  Past Medical  History  Diagnosis Date  . Anxiety   . Depression   . Allergy   . Fibrocystic breast disease   . Endometriosis   . Vitamin D deficiency    Surgical History:  Past Surgical History  Procedure Laterality Date  . Lasik    . Laparotomy      for endometriosis   Family History:  Family History  Problem Relation Age of Onset  .  Hypertension Mother   . Hypertension Father   . Heart disease Father   . COPD Father   . Diabetes Father    Social History:  Social History  Substance Use Topics  . Smoking status: Never Smoker   . Smokeless tobacco: Never Used  . Alcohol Use: No   Review of Systems  Constitutional: Negative.   HENT: Negative.   Eyes: Negative.   Respiratory: Negative.   Cardiovascular: Negative.   Gastrointestinal: Negative.   Genitourinary: Negative.   Musculoskeletal: Negative.   Skin: Negative.  Negative for itching and rash.       Several seb keratosis all over back, would like frozen off.   Neurological: Negative.   Endo/Heme/Allergies: Negative.   Psychiatric/Behavioral: Negative.     Physical Exam: Estimated body mass index is 25.77 kg/(m^2) as calculated from the following:   Height as of this encounter:  (1.753 m).   Weight as of this encounter: 174 lb 9.6 oz (79.198 kg). BP 124/70 mmHg  Pulse 72  Temp(Src) 97.3 F (36.3 C)  Resp 16  Ht  (1.753 m)  Wt 174 lb 9.6 oz (79.198 kg)  BMI 25.77 kg/m2  SpO2 98% General Appearance: Well nourished, in no apparent distress. Eyes: PERRLA, EOMs, conjunctiva no swelling or erythema, normal fundi and vessels. Sinuses: No Frontal/maxillary tenderness ENT/Mouth: Ext aud canals clear, normal light reflex with TMs without erythema, bulging.  Good dentition. No erythema, swelling, or exudate on post pharynx. Tonsils not swollen or erythematous. Hearing normal.  Neck: Supple, thyroid normal. No bruits Respiratory: Respiratory effort normal, BS equal bilaterally without rales, rhonchi, wheezing or stridor. Cardio: RRR without murmurs, rubs or gallops. Brisk peripheral pulses without edema.  Chest: symmetric, with normal excursions and percussion. Breasts: defer Abdomen: Soft, +BS. Non tender, no guarding, rebound, hernias, masses, or organomegaly. .  Lymphatics: Non tender without lymphadenopathy.  Genitourinary: normal external  genitalia, vulva, vagina, cervix, uterus and adnexa, PAP sent today. Musculoskeletal: Full ROM all peripheral extremities,5/5 strength, and normal gait. Skin: Several seb keratosis on back, chest. Warm, dry without rashes,ecchymosis.  Neuro: Cranial nerves intact, reflexes equal bilaterally. Normal muscle tone, no cerebellar symptoms. Sensation intact.  Psych: Awake and oriented X 3, normal affect, Insight and Judgment appropriate.   EKG: WNL    Colleen Alexander 2:17 PM

## 2015-07-11 NOTE — Patient Instructions (Signed)

## 2015-07-12 LAB — URINALYSIS, ROUTINE W REFLEX MICROSCOPIC
Bilirubin Urine: NEGATIVE
Glucose, UA: NEGATIVE
HGB URINE DIPSTICK: NEGATIVE
Ketones, ur: NEGATIVE
LEUKOCYTES UA: NEGATIVE
NITRITE: NEGATIVE
PH: 6 (ref 5.0–8.0)
Protein, ur: NEGATIVE
Specific Gravity, Urine: 1.026 (ref 1.001–1.035)

## 2015-07-12 LAB — MICROALBUMIN / CREATININE URINE RATIO
CREATININE, URINE: 177 mg/dL (ref 20–320)
MICROALB UR: 0.5 mg/dL
MICROALB/CREAT RATIO: 3 ug/mg{creat} (ref <30)

## 2015-07-12 LAB — VITAMIN D 25 HYDROXY (VIT D DEFICIENCY, FRACTURES): Vit D, 25-Hydroxy: 46 ng/mL (ref 30–100)

## 2015-07-12 LAB — PROGESTERONE: Progesterone: 0.7 ng/mL

## 2015-07-12 LAB — TSH: TSH: 1.01 m[IU]/L

## 2015-07-13 LAB — CYTOLOGY - PAP

## 2015-07-14 LAB — ESTROGENS, TOTAL: Estrogen: 118.9 pg/mL

## 2015-08-19 ENCOUNTER — Encounter: Payer: Self-pay | Admitting: *Deleted

## 2015-08-28 IMAGING — CR DG KNEE COMPLETE 4+V*L*
4 series · 4 of 4 positions shown · non-contrast
Comparison: None.

CLINICAL DATA: Chronic posterior left knee pain over the past 7
months, worsened with exercise. No known injury.

EXAM:
LEFT KNEE - COMPLETE 4+ VIEW

[view not recorded (1 of 4)]
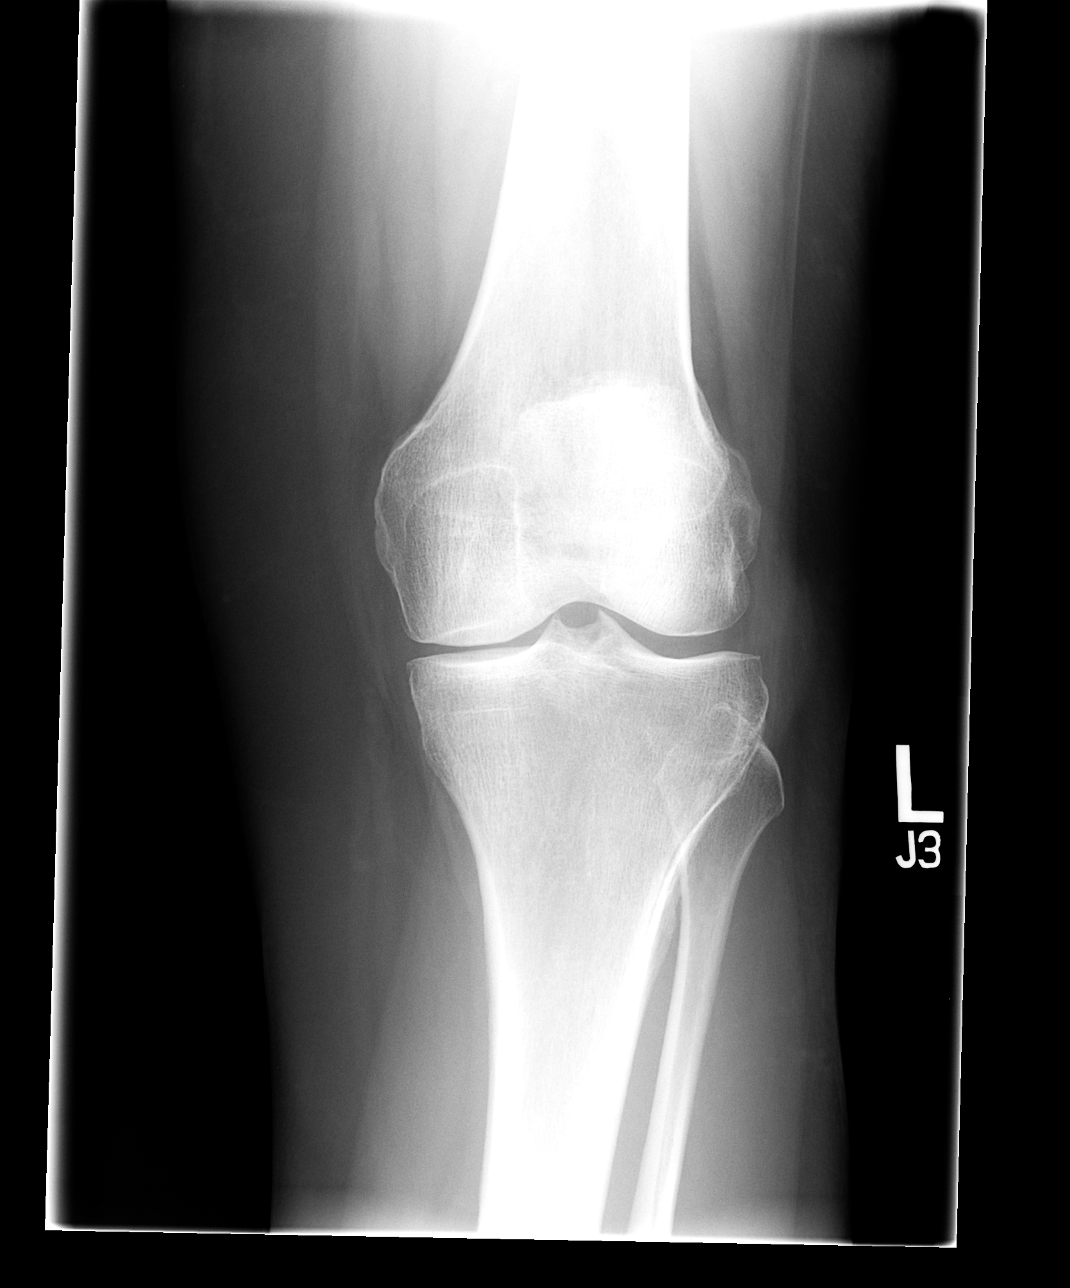

[view not recorded (2 of 4)]
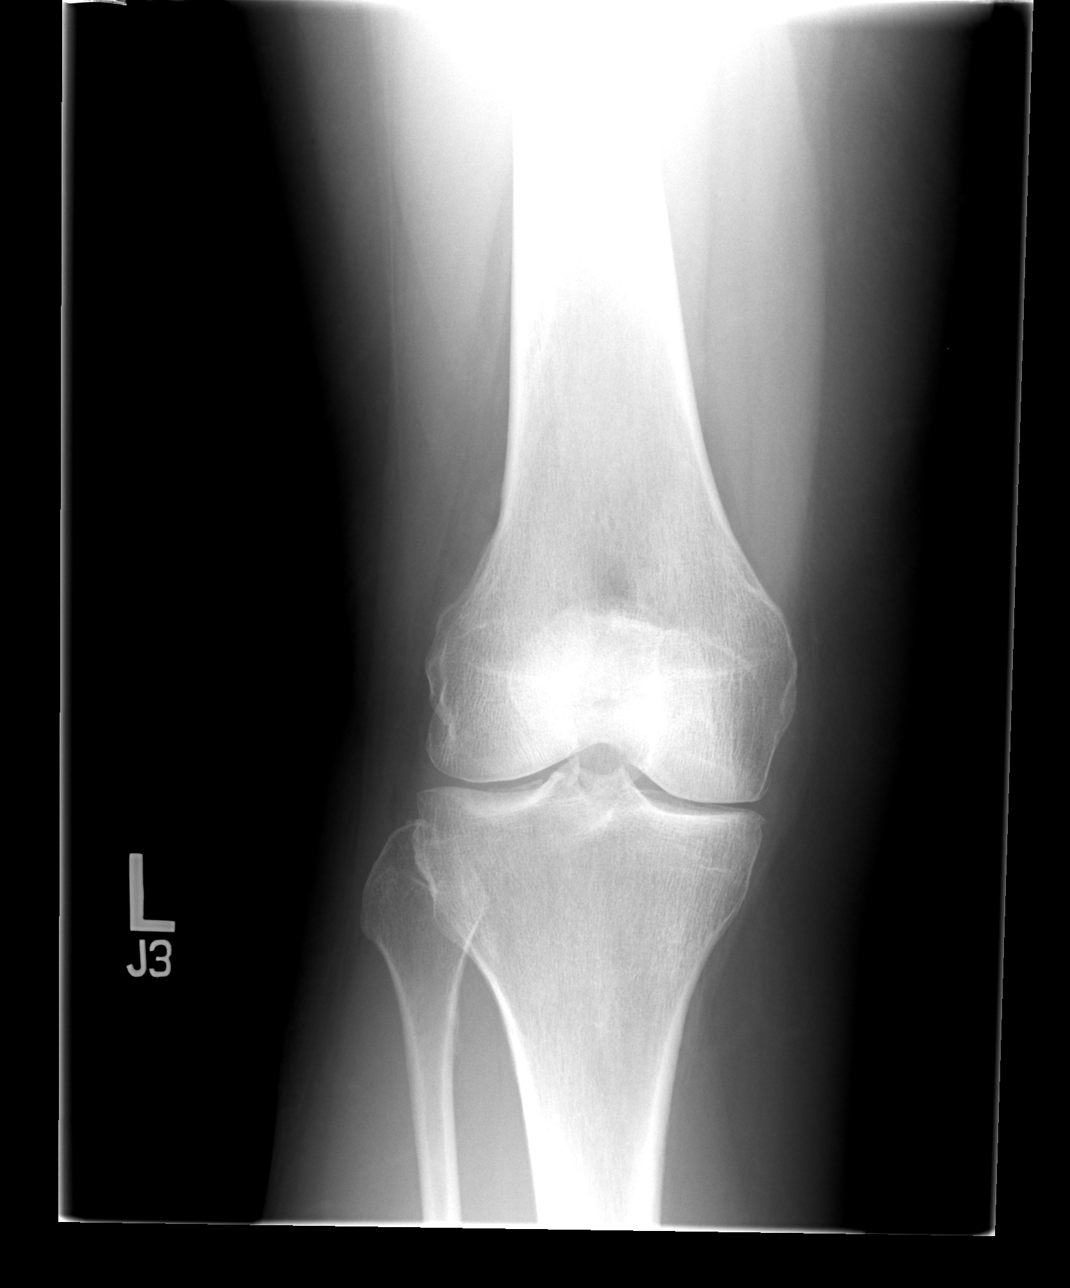

[view not recorded (3 of 4)]
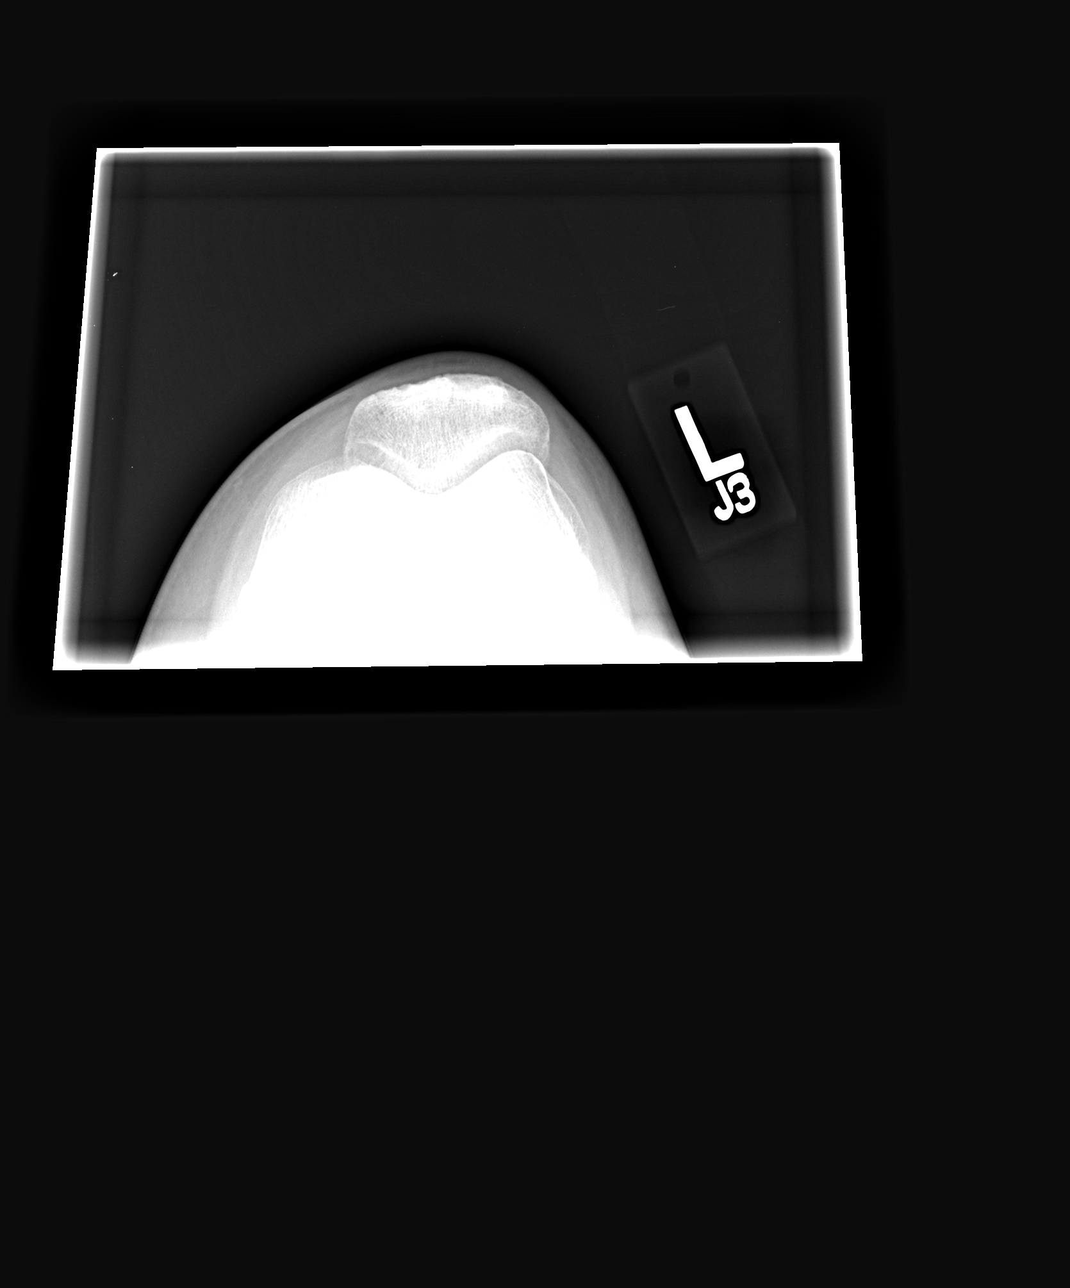

[view not recorded (4 of 4)]
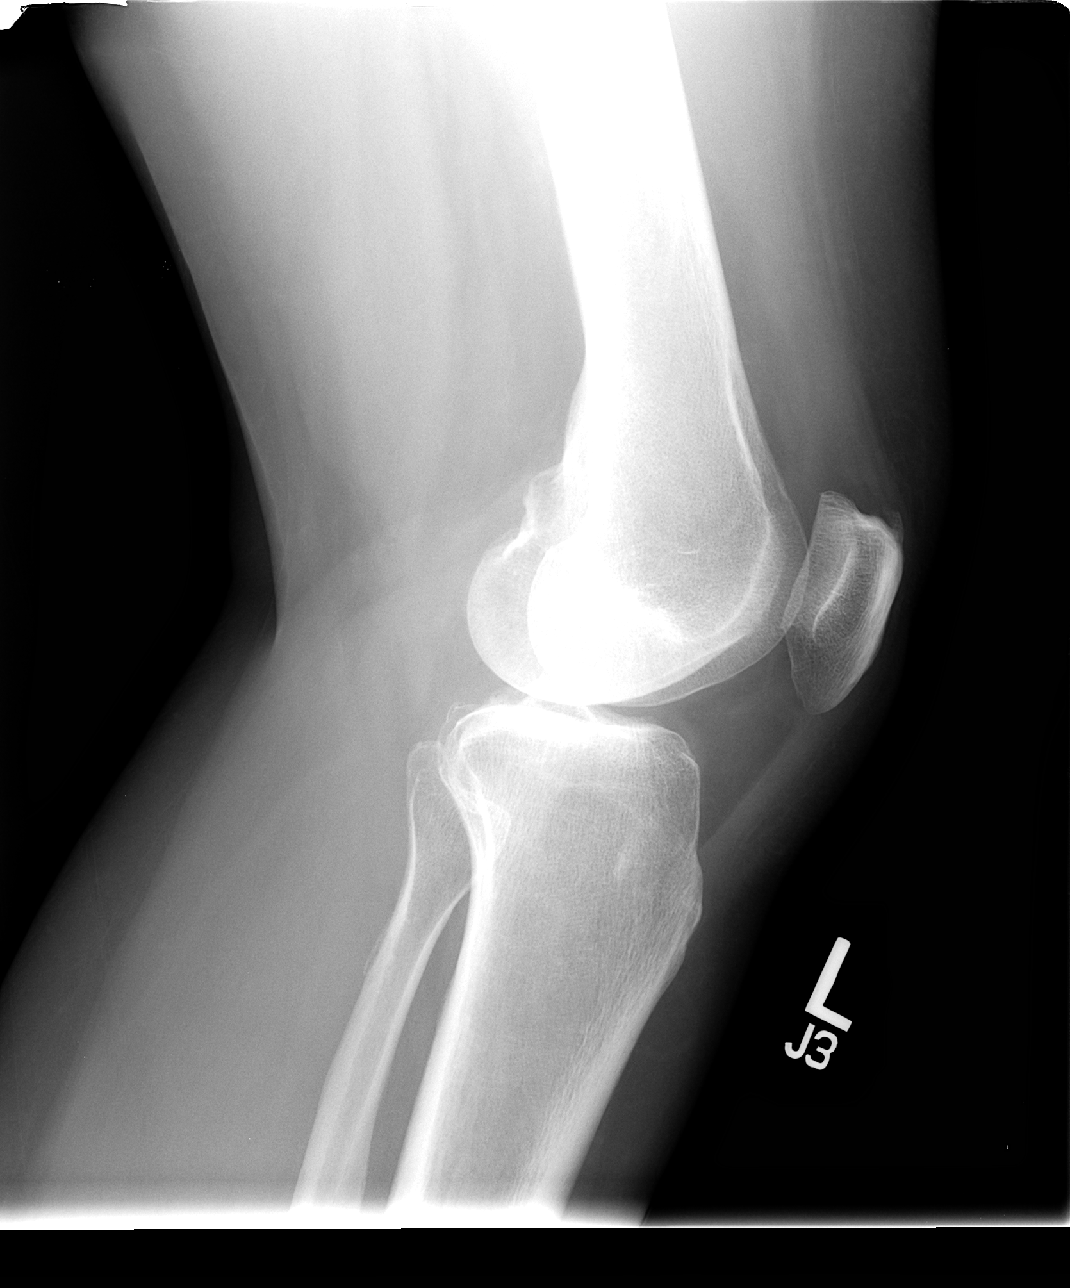

[4 of 4 positions shown; findings below may reference images not displayed]

FINDINGS: No evidence of acute, subacute or healed fracture. Mild-to-moderate
medial compartment joint space narrowing with associated minimal
hypertrophic spurring. Patellofemoral and lateral compartment joint
spaces well preserved. Small enthesopathic spur at the insertion of
the patellar tendon on the superior patella. Well preserved bone
mineral density. No visible joint effusion. No evidence of a
patellar tracking abnormality on the sunrise view.
IMPRESSION: No acute or subacute osseous abnormality. Mild to moderate medial
compartment osteoarthritis.

## 2015-09-22 ENCOUNTER — Other Ambulatory Visit: Payer: Self-pay | Admitting: Internal Medicine

## 2016-04-04 ENCOUNTER — Encounter: Payer: Self-pay | Admitting: *Deleted

## 2016-04-19 ENCOUNTER — Other Ambulatory Visit: Payer: Self-pay | Admitting: Physician Assistant

## 2016-06-11 ENCOUNTER — Encounter: Payer: Self-pay | Admitting: *Deleted

## 2016-07-14 ENCOUNTER — Encounter: Payer: Self-pay | Admitting: Internal Medicine

## 2016-07-28 NOTE — Progress Notes (Signed)
Complete Physical  Assessment and Plan:  Anxiety continue medications, stress management techniques discussed, increase water, good sleep hygiene discussed, increase exercise, and increase veggies.  - TSH - sertraline (ZOLOFT) 50 MG tablet; Take 1 tablet (50 mg total) by mouth daily.  Dispense: 90 tablet; Refill: 3   Depression - sertraline (ZOLOFT) 50 MG tablet; Take 1 tablet (50 mg total) by mouth daily.  Dispense: 90 tablet; Refill: 3   Vitamin D deficiency Continue supplement - Vit D  25 hydroxy (rtn osteoporosis monitoring)  Allergy, subsequent encounter Allergic rhinitis - Allegra OTC, increase H20, allergy hygiene explained.   Fibrocystic breast disease, unspecified laterality Self breast exams, get MGM 3D   Hypertriglyceridemia -continue medications, check lipids, decrease fatty foods, increase activity.  - Lipid panel - Microalbumin / creatinine urine ratio - Urinalysis, Routine w reflex microscopic   Encounter for general adult medical examination with abnormal findings Due colonoscopy  Medication management - CBC with Differential/Platelet - BASIC METABOLIC PANEL WITH GFR - Hepatic function panel - Magnesium  Estrogen deficiency -     DG Bone Density; Future  Screening for viral disease -     HIV antibody -     Hepatitis C antibody  Anemia, unspecified type -     Iron and TIBC -     Vitamin B12   Health Maintenance Discussed med's effects and SE's. Screening labs and tests as requested with regular follow-up as recommended.  HPI 61 y.o. female  presents for a complete physical. Her blood pressure has been controlled at home, today their BP is BP: 124/72  Colleen Alexander died age 89, 2 months ago.  She does workout, pure barre, 3 times a week. She denies chest pain, shortness of breath, dizziness.  She is not on cholesterol medication and denies myalgias. Her cholesterol is at goal. The cholesterol last visit was:   Lab Results  Component Value Date   CHOL 197 07/11/2015   HDL 62 07/11/2015   LDLCALC 108 07/11/2015   TRIG 135 07/11/2015   CHOLHDL 3.2 07/11/2015   Patient is on Vitamin D supplement, Vitamin D 60 She is on zoloft for depression/anxiety which helps.  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  Her husband has lost his mom and had some deaths in the family.   Current Medications:  Current Outpatient Prescriptions on File Prior to Visit  Medication Sig Dispense Refill  . B Complex Vitamins (B COMPLEX PO) Take by mouth daily.    . Cholecalciferol (VITAMIN D PO) Take 3,600 Int'l Units by mouth daily.    . Nutritional Supplements (GLA 250 PO) Take by mouth.    Marland Kitchen OVER THE COUNTER MEDICATION daily. VITALIZER STRIP    . OVER THE COUNTER MEDICATION daily. NUTRIFERON    . OVER THE COUNTER MEDICATION daily. OSTEOMATRIX    . OVER THE COUNTER MEDICATION daily. MINDWORKS    . OVER THE COUNTER MEDICATION daily. SOY PROTEIN DRINK IN A.M.    . sertraline (ZOLOFT) 50 MG tablet TAKE 1 TABLET (50 MG TOTAL) BY MOUTH DAILY. 90 tablet 1   No current facility-administered medications on file prior to visit.    Health Maintenance:   Immunization History  Administered Date(s) Administered  . Tdap 06/27/2013   Tetanus: 2015 Pneumovax: N/A Prevnar 13: N/A Flu vaccine: Declines Zostavax: N/A Pap: 2017 MGM: 05/2015 at [facility] DEXA: N/A, has not had, no fractures, non smoker, no fm hx.  Colonoscopy: 2008 due 2018, Dr. Randa Evens EGD:  N/A  Medical History:  Past Medical History:  Diagnosis Date  . Allergy   . Anxiety   . Depression   . Endometriosis   . Fibrocystic breast disease   . Vitamin D deficiency    Allergies Allergies  Allergen Reactions  . Sulfa Antibiotics     SURGICAL HISTORY She  has a past surgical history that includes LASIK and laparotomy. FAMILY HISTORY Her family history includes COPD in her father; Diabetes in her father; Heart disease in her father; Hypertension in her  father and mother. SOCIAL HISTORY She  reports that she has never smoked. She has never used smokeless tobacco. She reports that she does not drink alcohol or use drugs.  Review of Systems  Constitutional: Negative.   HENT: Negative.   Eyes: Negative.   Respiratory: Negative.   Cardiovascular: Negative.   Gastrointestinal: Negative.   Genitourinary: Negative.   Musculoskeletal: Negative.   Skin: Negative.  Negative for itching and rash.       Several seb keratosis all over back, would like frozen off.   Neurological: Negative.   Endo/Heme/Allergies: Negative.   Psychiatric/Behavioral: Negative.     Physical Exam: Estimated body mass index is 25.67 kg/m as calculated from the following:   Height as of this encounter:  (1.753 m).   Weight as of this encounter: 173 lb 12.8 oz (78.8 kg). BP 124/72   Pulse 88   Temp 98.6 F (37 C)   Resp 14   Ht  (1.753 m)   Wt 173 lb 12.8 oz (78.8 kg)   SpO2 98%   BMI 25.67 kg/m  General Appearance: Well nourished, in no apparent distress. Eyes: PERRLA, EOMs, conjunctiva no swelling or erythema, normal fundi and vessels. Sinuses: No Frontal/maxillary tenderness ENT/Mouth: Ext aud canals clear, normal light reflex with TMs without erythema, bulging.  Good dentition. No erythema, swelling, or exudate on post pharynx. Tonsils not swollen or erythematous. Hearing normal.  Neck: Supple, thyroid normal. No bruits Respiratory: Respiratory effort normal, BS equal bilaterally without rales, rhonchi, wheezing or stridor. Cardio: RRR without murmurs, rubs or gallops. Brisk peripheral pulses without edema.  Chest: symmetric, with normal excursions and percussion. Breasts: defer Abdomen: Soft, +BS. Non tender, no guarding, rebound, hernias, masses, or organomegaly. .  Lymphatics: Non tender without lymphadenopathy.  Genitourinary: defer Musculoskeletal: Full ROM all peripheral extremities,5/5 strength, and normal gait. Skin: Several seb  keratosis on back, chest. Warm, dry without rashes,ecchymosis.  Neuro: Cranial nerves intact, reflexes equal bilaterally. Normal muscle tone, no cerebellar symptoms. Sensation intact.  Psych: Awake and oriented X 3, normal affect, Insight and Judgment appropriate.   EKG: defer    Colleen Alexander 3:31 PM

## 2016-07-29 ENCOUNTER — Ambulatory Visit (INDEPENDENT_AMBULATORY_CARE_PROVIDER_SITE_OTHER): Payer: PRIVATE HEALTH INSURANCE | Admitting: Physician Assistant

## 2016-07-29 ENCOUNTER — Encounter: Payer: Self-pay | Admitting: Physician Assistant

## 2016-07-29 VITALS — BP 124/72 | HR 88 | Temp 98.6°F | Resp 14 | Ht 69.0 in | Wt 173.8 lb

## 2016-07-29 DIAGNOSIS — Z79899 Other long term (current) drug therapy: Secondary | ICD-10-CM

## 2016-07-29 DIAGNOSIS — Z0000 Encounter for general adult medical examination without abnormal findings: Secondary | ICD-10-CM

## 2016-07-29 DIAGNOSIS — E2839 Other primary ovarian failure: Secondary | ICD-10-CM

## 2016-07-29 DIAGNOSIS — F419 Anxiety disorder, unspecified: Secondary | ICD-10-CM

## 2016-07-29 DIAGNOSIS — E559 Vitamin D deficiency, unspecified: Secondary | ICD-10-CM

## 2016-07-29 DIAGNOSIS — N6019 Diffuse cystic mastopathy of unspecified breast: Secondary | ICD-10-CM

## 2016-07-29 DIAGNOSIS — D649 Anemia, unspecified: Secondary | ICD-10-CM

## 2016-07-29 DIAGNOSIS — F325 Major depressive disorder, single episode, in full remission: Secondary | ICD-10-CM

## 2016-07-29 DIAGNOSIS — E781 Pure hyperglyceridemia: Secondary | ICD-10-CM

## 2016-07-29 DIAGNOSIS — Z1159 Encounter for screening for other viral diseases: Secondary | ICD-10-CM | POA: Diagnosis not present

## 2016-07-29 DIAGNOSIS — T7840XD Allergy, unspecified, subsequent encounter: Secondary | ICD-10-CM

## 2016-07-29 DIAGNOSIS — Z0001 Encounter for general adult medical examination with abnormal findings: Secondary | ICD-10-CM

## 2016-07-29 LAB — CBC WITH DIFFERENTIAL/PLATELET
BASOS ABS: 69 {cells}/uL (ref 0–200)
BASOS PCT: 1 %
EOS ABS: 138 {cells}/uL (ref 15–500)
Eosinophils Relative: 2 %
HEMATOCRIT: 40.8 % (ref 35.0–45.0)
Hemoglobin: 13.2 g/dL (ref 11.7–15.5)
Lymphocytes Relative: 30 %
Lymphs Abs: 2070 cells/uL (ref 850–3900)
MCH: 29.5 pg (ref 27.0–33.0)
MCHC: 32.4 g/dL (ref 32.0–36.0)
MCV: 91.1 fL (ref 80.0–100.0)
MONO ABS: 690 {cells}/uL (ref 200–950)
MPV: 10.3 fL (ref 7.5–12.5)
Monocytes Relative: 10 %
NEUTROS ABS: 3933 {cells}/uL (ref 1500–7800)
Neutrophils Relative %: 57 %
Platelets: 252 10*3/uL (ref 140–400)
RBC: 4.48 MIL/uL (ref 3.80–5.10)
RDW: 13.3 % (ref 11.0–15.0)
WBC: 6.9 10*3/uL (ref 3.8–10.8)

## 2016-07-29 MED ORDER — SERTRALINE HCL 50 MG PO TABS: ORAL_TABLET | ORAL | 3 refills | Status: DC

## 2016-07-29 NOTE — Patient Instructions (Addendum)
Due for you colonoscopy  What is the TMJ? The temporomandibular (tem-PUH-ro-man-DIB-yoo-ler) joint, or the TMJ, connects the upper and lower jawbones. This joint allows the jaw to open wide and move back and forth when you chew, talk, or yawn.There are also several muscles that help this joint move. There can be muscle tightness and pain in the muscle that can cause several symptoms.  What causes TMJ pain? There are many causes of TMJ pain. Repeated chewing (for example, chewing gum) and clenching your teeth can cause pain in the joint. Some TMJ pain has no obvious cause. What can I do to ease the pain? There are many things you can do to help your pain get better. When you have pain:  Eat soft foods and stay away from chewy foods (for example, taffy) Try to use both sides of your mouth to chew Don't chew gum Massage Don't open your mouth wide (for example, during yawning or singing) Don't bite your cheeks or fingernails Lower your amount of stress and worry Applying a warm, damp washcloth to the joint may help. Over-the-counter pain medicines such as ibuprofen (one brand: Advil) or acetaminophen (one brand: Tylenol) might also help. Do not use these medicines if you are allergic to them or if your doctor told you not to use them. How can I stop the pain from coming back? When your pain is better, you can do these exercises to make your muscles stronger and to keep the pain from coming back:  Resisted mouth opening: Place your thumb or two fingers under your chin and open your mouth slowly, pushing up lightly on your chin with your thumb. Hold for three to six seconds. Close your mouth slowly. Resisted mouth closing: Place your thumbs under your chin and your two index fingers on the ridge between your mouth and the bottom of your chin. Push down lightly on your chin as you close your mouth. Tongue up: Slowly open and close your mouth while keeping the tongue touching the roof of the  mouth. Side-to-side jaw movement: Place an object about one fourth of an inch thick (for example, two tongue depressors) between your front teeth. Slowly move your jaw from side to side. Increase the thickness of the object as the exercise becomes easier Forward jaw movement: Place an object about one fourth of an inch thick between your front teeth and move the bottom jaw forward so that the bottom teeth are in front of the top teeth. Increase the thickness of the object as the exercise becomes easier. These exercises should not be painful. If it hurts to do these exercises, stop doing them and talk to your family doctor.   VAGINAL DRYNESS OVERVIEW  Vaginal dryness, also known as atrophic vaginitis, is a common condition in postmenopausal women. This condition is also common in women who have had both ovaries removed at the time of hysterectomy.   Some women have uncomfortable symptoms of vaginal dryness, such as pain with sex, burning vaginal discomfort or itching, or abnormal vaginal discharge, while others have no symptoms at all.  VAGINAL DRYNESS CAUSES   Estrogen helps to keep the vagina moist and to maintain thickness of the vaginal lining. Vaginal dryness occurs when the ovaries produce a decreased amount of estrogen. This can occur at certain times in a woman's life, and may be permanent or temporary. Times when less estrogen is made include: ?At the time of menopause. ?After surgical removal of the ovaries, chemotherapy, or radiation therapy of the  pelvis for cancer. ?After having a baby, particularly in women who breastfeed. ?While using certain medications, such as danazol, medroxyprogesterone (brand names: Provera or DepoProvera), leuprolide (brand name: Lupron), or nafarelin. When these medications are stopped, estrogen production resumes.  Women who smoke cigarettes have been shown to have an increased risk of an earlier menopause transition as compared to non-smokers. Therefore,  atrophic vaginitis symptoms may appear at a younger age in this population.  VAGINAL DRYNESS TREATMENT   There are three treatment options for women with vaginal dryness:  Vaginal lubricants and moisturizers - Vaginal lubricants and moisturizers can be purchased without a prescription. These products do not contain any hormones and have virtually no side effects. - Albolene is found in the facial cleanser section at [facility], [facility], or [employer]. It is a large jar with a blue top. This is the best lubricant for women because it is hypoallergenic. -Natural lubricants, such as olive, avocado or peanut oil, are easily available products that may be used as a lubricant with sex.  -Vaginal moisturizes (eg, Replens, Moist Again, Vagisil, K-Y Silk-E, and Feminease) are formulated to allow water to be retained in the vaginal tissues. Moisturizers are applied into the vagina three times weekly to allow a continued moisturizing effect. These should not be used just before having sex, as they can be irritating.  Vaginal estrogen - Vaginal estrogen is the most effective treatment option for women with vaginal dryness. Vaginal estrogen must be prescribed by a healthcare provider. Very low doses of vaginal estrogen can be used when it is put into the vagina to treat vaginal dryness. A small amount of estrogen is absorbed into the bloodstream, but only about 100 times less than when using estrogen pills or tablets. As a result, there is a much lower risk of side effects, such as blood clots, breast cancer, and heart attack, compared with other estrogen-containing products (birth control pills, menopausal hormone therapy).   Ospemifene - Ospemifene is a prescription medication that is similar to estrogen, but is not estrogen. In the vaginal tissue, it acts similarly to estrogen. In the breast tissue, it acts as an estrogen blocker. It comes in a pill, and is prescribed for women who want to use an estrogen-like medication  for vaginal dryness or painful sex associated with vaginal dryness, but prefer not to use a vaginal medication. The medication may cause hot flashes as a side effect. This type of medication may increase the risk of blood clots or uterine cancer. Further study of ospemifene is needed to evaluate the risk of these complications. This medication has not been tested in women who have had breast cancer or are at a high risk of developing breast cancer.    Sexual activity - Vaginal estrogen improves vaginal dryness quickly, usually within a few weeks. You may continue to have sex as you treat vaginal dryness because sex itself can help to keep the vaginal tissues healthy. Vaginal intercourse may help the vaginal tissues by keeping them soft and stretchable and preventing the tissues from shrinking.  If sex continues to be painful despite treatment for vaginal dryness, talk to your healthcare provider.

## 2016-07-30 ENCOUNTER — Other Ambulatory Visit: Payer: Self-pay | Admitting: Physician Assistant

## 2016-07-30 DIAGNOSIS — Z79899 Other long term (current) drug therapy: Secondary | ICD-10-CM

## 2016-07-30 LAB — BASIC METABOLIC PANEL WITH GFR
BUN: 19 mg/dL (ref 7–25)
CHLORIDE: 102 mmol/L (ref 98–110)
CO2: 27 mmol/L (ref 20–31)
Calcium: 9.5 mg/dL (ref 8.6–10.4)
Creat: 1.09 mg/dL — ABNORMAL HIGH (ref 0.50–1.05)
GFR, Est African American: 64 mL/min (ref >=60)
GFR, Est Non African American: 56 mL/min — ABNORMAL LOW (ref >=60)
GLUCOSE: 92 mg/dL (ref 65–99)
POTASSIUM: 4.1 mmol/L (ref 3.5–5.3)
Sodium: 140 mmol/L (ref 135–146)

## 2016-07-30 LAB — LIPID PANEL
CHOLESTEROL: 205 mg/dL — AB (ref <200)
HDL: 68 mg/dL (ref >50)
LDL CALC: 114 mg/dL — AB (ref <100)
TRIGLYCERIDES: 116 mg/dL (ref <150)
Total CHOL/HDL Ratio: 3 Ratio (ref <5.0)
VLDL: 23 mg/dL (ref <30)

## 2016-07-30 LAB — HIV ANTIBODY (ROUTINE TESTING W REFLEX): HIV 1&2 Ab, 4th Generation: NONREACTIVE

## 2016-07-30 LAB — IRON AND TIBC
%SAT: 26 % (ref 11–50)
IRON: 85 ug/dL (ref 45–160)
TIBC: 329 ug/dL (ref 250–450)
UIBC: 244 ug/dL (ref 125–400)

## 2016-07-30 LAB — HEPATIC FUNCTION PANEL
ALT: 21 U/L (ref 6–29)
AST: 26 U/L (ref 10–35)
Albumin: 4.3 g/dL (ref 3.6–5.1)
Alkaline Phosphatase: 64 U/L (ref 33–130)
BILIRUBIN INDIRECT: 0.5 mg/dL (ref 0.2–1.2)
Bilirubin, Direct: 0.1 mg/dL (ref <=0.2)
TOTAL PROTEIN: 7 g/dL (ref 6.1–8.1)
Total Bilirubin: 0.6 mg/dL (ref 0.2–1.2)

## 2016-07-30 LAB — TSH: TSH: 1.14 m[IU]/L

## 2016-07-30 LAB — HEPATITIS C ANTIBODY: HCV Ab: NEGATIVE

## 2016-07-30 LAB — VITAMIN B12: VITAMIN B 12: 1016 pg/mL (ref 200–1100)

## 2016-07-30 LAB — VITAMIN D 25 HYDROXY (VIT D DEFICIENCY, FRACTURES): Vit D, 25-Hydroxy: 38 ng/mL (ref 30–100)

## 2016-07-30 LAB — MAGNESIUM: MAGNESIUM: 2.1 mg/dL (ref 1.5–2.5)

## 2016-08-28 ENCOUNTER — Other Ambulatory Visit: Payer: Self-pay

## 2016-08-28 MED ORDER — MELOXICAM 15 MG PO TABS: 15.0000 mg | ORAL_TABLET | Freq: Every day | ORAL | 0 refills | Status: DC

## 2016-09-03 ENCOUNTER — Other Ambulatory Visit: Payer: PRIVATE HEALTH INSURANCE

## 2016-09-03 DIAGNOSIS — Z79899 Other long term (current) drug therapy: Secondary | ICD-10-CM

## 2016-09-03 LAB — BASIC METABOLIC PANEL WITH GFR
BUN: 16 mg/dL (ref 7–25)
CO2: 27 mmol/L (ref 20–31)
Calcium: 9.3 mg/dL (ref 8.6–10.4)
Chloride: 106 mmol/L (ref 98–110)
Creat: 0.89 mg/dL (ref 0.50–0.99)
GFR, EST NON AFRICAN AMERICAN: 71 mL/min (ref >=60)
GFR, Est African American: 81 mL/min (ref >=60)
Glucose, Bld: 88 mg/dL (ref 65–99)
POTASSIUM: 4.1 mmol/L (ref 3.5–5.3)
SODIUM: 141 mmol/L (ref 135–146)

## 2017-01-19 LAB — HM COLONOSCOPY

## 2017-06-05 ENCOUNTER — Other Ambulatory Visit: Payer: Self-pay | Admitting: *Deleted

## 2017-06-05 MED ORDER — SERTRALINE HCL 50 MG PO TABS: ORAL_TABLET | ORAL | 0 refills | Status: DC

## 2017-08-03 ENCOUNTER — Ambulatory Visit: Payer: PRIVATE HEALTH INSURANCE | Admitting: Physician Assistant

## 2017-08-03 ENCOUNTER — Encounter: Payer: Self-pay | Admitting: Physician Assistant

## 2017-08-03 VITALS — BP 110/68 | HR 79 | Temp 97.2°F | Resp 16 | Ht 69.0 in | Wt 170.0 lb

## 2017-08-03 DIAGNOSIS — Z1329 Encounter for screening for other suspected endocrine disorder: Secondary | ICD-10-CM

## 2017-08-03 DIAGNOSIS — N6019 Diffuse cystic mastopathy of unspecified breast: Secondary | ICD-10-CM

## 2017-08-03 DIAGNOSIS — T7840XD Allergy, unspecified, subsequent encounter: Secondary | ICD-10-CM

## 2017-08-03 DIAGNOSIS — Z0000 Encounter for general adult medical examination without abnormal findings: Secondary | ICD-10-CM

## 2017-08-03 DIAGNOSIS — Z13220 Encounter for screening for lipoid disorders: Secondary | ICD-10-CM

## 2017-08-03 DIAGNOSIS — E2839 Other primary ovarian failure: Secondary | ICD-10-CM

## 2017-08-03 DIAGNOSIS — Z1389 Encounter for screening for other disorder: Secondary | ICD-10-CM | POA: Diagnosis not present

## 2017-08-03 DIAGNOSIS — F325 Major depressive disorder, single episode, in full remission: Secondary | ICD-10-CM

## 2017-08-03 DIAGNOSIS — Z6825 Body mass index (BMI) 25.0-25.9, adult: Secondary | ICD-10-CM

## 2017-08-03 DIAGNOSIS — F419 Anxiety disorder, unspecified: Secondary | ICD-10-CM

## 2017-08-03 DIAGNOSIS — E559 Vitamin D deficiency, unspecified: Secondary | ICD-10-CM

## 2017-08-03 DIAGNOSIS — E781 Pure hyperglyceridemia: Secondary | ICD-10-CM

## 2017-08-03 DIAGNOSIS — Z79899 Other long term (current) drug therapy: Secondary | ICD-10-CM

## 2017-08-03 DIAGNOSIS — Z0001 Encounter for general adult medical examination with abnormal findings: Secondary | ICD-10-CM

## 2017-08-03 NOTE — Patient Instructions (Addendum)
Mobic is an antiinflammatory It helps pain, can not take with aleve, or ibuprofen You can take tylenol ( ) or tylenol arthritis ( ) with the meloxicam/antiinflammatories. The max you can take of tylenol a day is  daily, this is a max of 6 pills a day of the regular tyelnol ( ) or a max of 4 a day of the tylenol arthritis ( ) as long as no other medications you are taking contain tylenol.   Mobic can cause inflammation in your stomach and can cause ulcers or bleeding, this will look like black tarry stools Make sure you take your mobic with food Try not to take it daily, take AS needed Can take with zantac   Check out  Mini habits for weight loss book

## 2017-08-03 NOTE — Progress Notes (Signed)
Complete Physical  Assessment and Plan:  Anxiety continue medications, stress management techniques discussed, increase water, good sleep hygiene discussed, increase exercise, and increase veggies.  - TSH - sertraline (ZOLOFT) 50 MG tablet; Take 1 tablet (50 mg total) by mouth daily.  Dispense: 90 tablet; Refill: 3   Depression - sertraline (ZOLOFT) 50 MG tablet; Take 1 tablet (50 mg total) by mouth daily.  Dispense: 90 tablet; Refill: 3   Vitamin D deficiency Continue supplement - Vit D  25 hydroxy (rtn osteoporosis monitoring)  Allergy, subsequent encounter Allergic rhinitis - Allegra OTC, increase H20, allergy hygiene explained.   Fibrocystic breast disease, unspecified laterality Self breast exams, get MGM 3D   Hypertriglyceridemia -continue medications, check lipids, decrease fatty foods, increase activity.  - Lipid panel - Microalbumin / creatinine urine ratio - Urinalysis, Routine w reflex microscopic   Encounter for general adult medical examination with abnormal findings Due colonoscopy  Medication management - CBC with Differential/Platelet - BASIC METABOLIC PANEL WITH GFR - Hepatic function panel - Magnesium  Estrogen deficiency -     DG Bone Density; Future  Anemia, unspecified type -     Iron and TIBC -     Vitamin B12   Health Maintenance Discussed med's effects and SE's. Screening labs and tests as requested with regular follow-up as recommended.  HPI 62 y.o. female  presents for a complete physical. Her blood pressure has been controlled at home, today their BP is BP: 110/68   She does workout, works out with trainer.  She denies chest pain, shortness of breath, dizziness.  She is not on cholesterol medication and denies myalgias. Her cholesterol is at goal. The cholesterol last visit was:   Lab Results  Component Value Date   CHOL 205 (H) 07/29/2016   HDL 68 07/29/2016   LDLCALC 114 (H) 07/29/2016   TRIG 116 07/29/2016   CHOLHDL 3.0  07/29/2016   Patient is on Vitamin D supplement, 2000 IU a day Lab Results  Component Value Date   VD25OH 38 07/29/2016   She is on zoloft for depression/anxiety which helps.  She is teaching wellness classes and grief counseling.   BMI is Body mass index is 25.1 kg/m., she is working on diet and exercise. She is working out 2-3 days a week.  Wt Readings from Last 3 Encounters:  08/03/17 170 lb (77.1 kg)  07/29/16 173 lb 12.8 oz (78.8 kg)  07/11/15 174 lb 9.6 oz (79.2 kg)     Current Medications:  Current Outpatient Medications on File Prior to Visit  Medication Sig Dispense Refill  . B Complex Vitamins (B COMPLEX PO) Take by mouth daily.    . Cholecalciferol (VITAMIN D PO) Take 3,600 Int'l Units by mouth daily.    . meloxicam (MOBIC) 15 MG tablet Take 1 tablet (15 mg total) by mouth daily. 30 tablet 0  . Nutritional Supplements (GLA 250 PO) Take by mouth.    Marland Kitchen OVER THE COUNTER MEDICATION daily. VITALIZER STRIP    . OVER THE COUNTER MEDICATION daily. NUTRIFERON    . OVER THE COUNTER MEDICATION daily. OSTEOMATRIX    . OVER THE COUNTER MEDICATION daily. SOY PROTEIN DRINK IN A.M.    . OVER THE COUNTER MEDICATION     . sertraline (ZOLOFT) 50 MG tablet TAKE 1 TABLET (50 MG TOTAL) BY MOUTH DAILY. 90 tablet 0   No current facility-administered medications on file prior to visit.    Health Maintenance:   Immunization History  Administered Date(s) Administered  .  Tdap 06/27/2013   Tetanus: 2015 Pneumovax: N/A Prevnar 13: N/A Flu vaccine: Declines Zostavax: N/A Pap: 2017 neg HPV, due 5 years MGM: 05/2017 at [facility] DEXA: N/A, has not had, no fractures, non smoker, no fm hx.  Colonoscopy:  2018, Dr. Randa Evens states had last year will get records EGD: N/A  Medical History:  Past Medical History:  Diagnosis Date  . Allergy   . Anxiety   . Depression   . Endometriosis   . Fibrocystic breast disease   . Vitamin D deficiency    Allergies Allergies  Allergen Reactions  .  Sulfa Antibiotics     SURGICAL HISTORY She  has a past surgical history that includes LASIK and laparotomy. FAMILY HISTORY Her family history includes COPD in her father; Diabetes in her father; Heart disease in her father; Hypertension in her father and mother. SOCIAL HISTORY She  reports that she has never smoked. She has never used smokeless tobacco. She reports that she does not drink alcohol or use drugs.  Review of Systems  Constitutional: Negative.   HENT: Negative.   Eyes: Negative.   Respiratory: Negative.   Cardiovascular: Negative.   Gastrointestinal: Negative.   Genitourinary: Negative.   Musculoskeletal: Negative.   Skin: Negative.  Negative for itching and rash.       Several seb keratosis all over back, would like frozen off.   Neurological: Negative.   Endo/Heme/Allergies: Negative.   Psychiatric/Behavioral: Negative.     Physical Exam: Estimated body mass index is 25.1 kg/m as calculated from the following:   Height as of this encounter:  (1.753 m).   Weight as of this encounter: 170 lb (77.1 kg). BP 110/68   Pulse 79   Temp (!) 97.2 F (36.2 C)   Resp 16   Ht  (1.753 m)   Wt 170 lb (77.1 kg)   SpO2 96%   BMI 25.10 kg/m  General Appearance: Well nourished, in no apparent distress. Eyes: PERRLA, EOMs, conjunctiva no swelling or erythema, normal fundi and vessels. Sinuses: No Frontal/maxillary tenderness ENT/Mouth: Ext aud canals clear, normal light reflex with TMs without erythema, bulging.  Good dentition. No erythema, swelling, or exudate on post pharynx. Tonsils not swollen or erythematous. Hearing normal.  Neck: Supple, thyroid normal. No bruits Respiratory: Respiratory effort normal, BS equal bilaterally without rales, rhonchi, wheezing or stridor. Cardio: RRR without murmurs, rubs or gallops. Brisk peripheral pulses without edema.  Chest: symmetric, with normal excursions and percussion. Breasts: defer Abdomen: Soft, +BS. Non tender,  no guarding, rebound, hernias, masses, or organomegaly. .  Lymphatics: Non tender without lymphadenopathy.  Genitourinary: defer Musculoskeletal: Full ROM all peripheral extremities,5/5 strength, and normal gait. Skin: Several seb keratosis on back, chest. Warm, dry without rashes,ecchymosis.  Neuro: Cranial nerves intact, reflexes equal bilaterally. Normal muscle tone, no cerebellar symptoms. Sensation intact.  Psych: Awake and oriented X 3, normal affect, Insight and Judgment appropriate.   EKG: defer    Quentin Mulling 3:17 PM

## 2017-08-04 LAB — CBC WITH DIFFERENTIAL/PLATELET
BASOS PCT: 0.8 %
Basophils Absolute: 53 cells/uL (ref 0–200)
EOS ABS: 132 {cells}/uL (ref 15–500)
Eosinophils Relative: 2 %
HCT: 37.3 % (ref 35.0–45.0)
Hemoglobin: 12.8 g/dL (ref 11.7–15.5)
Lymphs Abs: 1564 cells/uL (ref 850–3900)
MCH: 30 pg (ref 27.0–33.0)
MCHC: 34.3 g/dL (ref 32.0–36.0)
MCV: 87.6 fL (ref 80.0–100.0)
MONOS PCT: 8.7 %
MPV: 11.1 fL (ref 7.5–12.5)
NEUTROS PCT: 64.8 %
Neutro Abs: 4277 cells/uL (ref 1500–7800)
Platelets: 201 10*3/uL (ref 140–400)
RBC: 4.26 10*6/uL (ref 3.80–5.10)
RDW: 11.9 % (ref 11.0–15.0)
TOTAL LYMPHOCYTE: 23.7 %
WBC mixed population: 574 cells/uL (ref 200–950)
WBC: 6.6 10*3/uL (ref 3.8–10.8)

## 2017-08-04 LAB — URINALYSIS, ROUTINE W REFLEX MICROSCOPIC
BILIRUBIN URINE: NEGATIVE
GLUCOSE, UA: NEGATIVE
Hgb urine dipstick: NEGATIVE
KETONES UR: NEGATIVE
Leukocytes, UA: NEGATIVE
Nitrite: NEGATIVE
PROTEIN: NEGATIVE
Specific Gravity, Urine: 1.019 (ref 1.001–1.03)
pH: 6.5 (ref 5.0–8.0)

## 2017-08-04 LAB — COMPLETE METABOLIC PANEL WITH GFR
AG Ratio: 2 (calc) (ref 1.0–2.5)
ALBUMIN MSPROF: 4.3 g/dL (ref 3.6–5.1)
ALKALINE PHOSPHATASE (APISO): 58 U/L (ref 33–130)
ALT: 15 U/L (ref 6–29)
AST: 22 U/L (ref 10–35)
BUN: 19 mg/dL (ref 7–25)
CALCIUM: 9.5 mg/dL (ref 8.6–10.4)
CO2: 30 mmol/L (ref 20–32)
CREATININE: 0.87 mg/dL (ref 0.50–0.99)
Chloride: 103 mmol/L (ref 98–110)
GFR, EST AFRICAN AMERICAN: 84 mL/min/{1.73_m2} (ref >=60)
GFR, EST NON AFRICAN AMERICAN: 72 mL/min/{1.73_m2} (ref >=60)
GLOBULIN: 2.2 g/dL (ref 1.9–3.7)
GLUCOSE: 88 mg/dL (ref 65–99)
Potassium: 3.8 mmol/L (ref 3.5–5.3)
SODIUM: 141 mmol/L (ref 135–146)
TOTAL PROTEIN: 6.5 g/dL (ref 6.1–8.1)
Total Bilirubin: 0.7 mg/dL (ref 0.2–1.2)

## 2017-08-04 LAB — TSH: TSH: 1.2 m[IU]/L (ref 0.40–4.50)

## 2017-08-04 LAB — LIPID PANEL
Cholesterol: 193 mg/dL (ref <200)
HDL: 63 mg/dL (ref >50)
LDL CHOLESTEROL (CALC): 102 mg/dL — AB
Non-HDL Cholesterol (Calc): 130 mg/dL (calc) — ABNORMAL HIGH (ref <130)
TRIGLYCERIDES: 161 mg/dL — AB (ref <150)
Total CHOL/HDL Ratio: 3.1 (calc) (ref <5.0)

## 2017-08-04 LAB — MICROALBUMIN / CREATININE URINE RATIO
Creatinine, Urine: 130 mg/dL (ref 20–275)
Microalb Creat Ratio: 2 mcg/mg creat (ref <30)
Microalb, Ur: 0.3 mg/dL

## 2017-08-04 LAB — VITAMIN D 25 HYDROXY (VIT D DEFICIENCY, FRACTURES): VIT D 25 HYDROXY: 41 ng/mL (ref 30–100)

## 2017-09-28 ENCOUNTER — Other Ambulatory Visit: Payer: Self-pay | Admitting: *Deleted

## 2017-09-28 MED ORDER — SERTRALINE HCL 50 MG PO TABS: ORAL_TABLET | ORAL | 0 refills | Status: DC

## 2017-12-24 ENCOUNTER — Other Ambulatory Visit: Payer: Self-pay

## 2017-12-24 MED ORDER — SERTRALINE HCL 50 MG PO TABS: ORAL_TABLET | ORAL | 0 refills | Status: DC

## 2018-04-01 ENCOUNTER — Other Ambulatory Visit: Payer: Self-pay | Admitting: Internal Medicine

## 2018-06-07 LAB — HM MAMMOGRAPHY

## 2018-06-08 ENCOUNTER — Encounter: Payer: Self-pay | Admitting: *Deleted

## 2018-08-05 NOTE — Progress Notes (Deleted)
Complete Physical  Assessment and Plan:  Anxiety continue medications, stress management techniques discussed, increase water, good sleep hygiene discussed, increase exercise, and increase veggies.  - TSH - sertraline (ZOLOFT) 50 MG tablet; Take 1 tablet (50 mg total) by mouth daily.  Dispense: 90 tablet; Refill: 3   Depression - sertraline (ZOLOFT) 50 MG tablet; Take 1 tablet (50 mg total) by mouth daily.  Dispense: 90 tablet; Refill: 3   Vitamin D deficiency Continue supplement - Vit D  25 hydroxy (rtn osteoporosis monitoring)  Allergy, subsequent encounter Allergic rhinitis - Allegra OTC, increase H20, allergy hygiene explained.   Fibrocystic breast disease, unspecified laterality Self breast exams, get MGM 3D   Hypertriglyceridemia -continue medications, check lipids, decrease fatty foods, increase activity.  - Lipid panel - Microalbumin / creatinine urine ratio - Urinalysis, Routine w reflex microscopic   Encounter for general adult medical examination with abnormal findings Due colonoscopy  Medication management - CBC with Differential/Platelet - BASIC METABOLIC PANEL WITH GFR - Hepatic function panel - Magnesium  Estrogen deficiency -     DG Bone Density; Future  Anemia, unspecified type -     Iron and TIBC -     Vitamin B12   Health Maintenance Discussed med's effects and SE's. Screening labs and tests as requested with regular follow-up as recommended.  HPI 63 y.o. female  presents for a complete physical. Her blood pressure has been controlled at home, today their BP is     She does workout, works out with Psychologist, educational.  She denies chest pain, shortness of breath, dizziness.  She is not on cholesterol medication and denies myalgias. Her cholesterol is at goal. The cholesterol last visit was:   Lab Results  Component Value Date   CHOL 193 08/03/2017   HDL 63 08/03/2017   LDLCALC 102 (H) 08/03/2017   TRIG 161 (H) 08/03/2017   CHOLHDL 3.1 08/03/2017    Patient is on Vitamin D supplement, 2000 IU a day Lab Results  Component Value Date   VD25OH 41 08/03/2017   She is on zoloft for depression/anxiety which helps.  She is teaching wellness classes and grief counseling.   BMI is There is no height or weight on file to calculate BMI., she is working on diet and exercise. She is working out 2-3 days a week.  Wt Readings from Last 3 Encounters:  08/03/17 170 lb (77.1 kg)  07/29/16 173 lb 12.8 oz (78.8 kg)  07/11/15 174 lb 9.6 oz (79.2 kg)     Current Medications:  Current Outpatient Medications on File Prior to Visit  Medication Sig Dispense Refill  . B Complex Vitamins (B COMPLEX PO) Take by mouth daily.    . Cholecalciferol (VITAMIN D PO) Take 3,600 Int'l Units by mouth daily.    . meloxicam (MOBIC) 15 MG tablet Take 1 tablet (15 mg total) by mouth daily. 30 tablet 0  . Nutritional Supplements (GLA 250 PO) Take by mouth.    Marland Kitchen OVER THE COUNTER MEDICATION daily. VITALIZER STRIP    . OVER THE COUNTER MEDICATION daily. NUTRIFERON    . OVER THE COUNTER MEDICATION daily. OSTEOMATRIX    . OVER THE COUNTER MEDICATION daily. SOY PROTEIN DRINK IN A.M.    . OVER THE COUNTER MEDICATION     . sertraline (ZOLOFT) 50 MG tablet TAKE ONE TABLET BY MOUTH ONE TIME DAILY 90 tablet 1   No current facility-administered medications on file prior to visit.    Health Maintenance:   Immunization History  Administered Date(s) Administered  . Tdap 06/27/2013   Tetanus: 2015 Pneumovax: N/A Prevnar 13: N/A Flu vaccine: Declines Zostavax: N/A Pap: 2017 neg HPV, due 5 years MGM: 05/2017 at [facility] DEXA: N/A, has not had, no fractures, non smoker, no fm hx.  Colonoscopy:  2018, Dr. Randa EvensEdwards states had last year will get records EGD: N/A  Medical History:  Past Medical History:  Diagnosis Date  . Allergy   . Anxiety   . Depression   . Endometriosis   . Fibrocystic breast disease   . Vitamin D deficiency    Allergies Allergies  Allergen  Reactions  . Sulfa Antibiotics     SURGICAL HISTORY She  has a past surgical history that includes LASIK and laparotomy. FAMILY HISTORY Her family history includes COPD in her father; Diabetes in her father; Heart disease in her father; Hypertension in her father and mother. SOCIAL HISTORY She  reports that she has never smoked. She has never used smokeless tobacco. She reports that she does not drink alcohol or use drugs.  Review of Systems  Constitutional: Negative.   HENT: Negative.   Eyes: Negative.   Respiratory: Negative.   Cardiovascular: Negative.   Gastrointestinal: Negative.   Genitourinary: Negative.   Musculoskeletal: Negative.   Skin: Negative.  Negative for itching and rash.       Several seb keratosis all over back, would like frozen off.   Neurological: Negative.   Endo/Heme/Allergies: Negative.   Psychiatric/Behavioral: Negative.     Physical Exam: Estimated body mass index is 25.1 kg/m as calculated from the following:   Height as of 08/03/17: 5\' 9"  (1.753 m).   Weight as of 08/03/17: 170 lb (77.1 kg). There were no vitals taken for this visit. General Appearance: Well nourished, in no apparent distress. Eyes: PERRLA, EOMs, conjunctiva no swelling or erythema, normal fundi and vessels. Sinuses: No Frontal/maxillary tenderness ENT/Mouth: Ext aud canals clear, normal light reflex with TMs without erythema, bulging.  Good dentition. No erythema, swelling, or exudate on post pharynx. Tonsils not swollen or erythematous. Hearing normal.  Neck: Supple, thyroid normal. No bruits Respiratory: Respiratory effort normal, BS equal bilaterally without rales, rhonchi, wheezing or stridor. Cardio: RRR without murmurs, rubs or gallops. Brisk peripheral pulses without edema.  Chest: symmetric, with normal excursions and percussion. Breasts: defer Abdomen: Soft, +BS. Non tender, no guarding, rebound, hernias, masses, or organomegaly. .  Lymphatics: Non tender without  lymphadenopathy.  Genitourinary: defer Musculoskeletal: Full ROM all peripheral extremities,5/5 strength, and normal gait. Skin: Several seb keratosis on back, chest. Warm, dry without rashes,ecchymosis.  Neuro: Cranial nerves intact, reflexes equal bilaterally. Normal muscle tone, no cerebellar symptoms. Sensation intact.  Psych: Awake and oriented X 3, normal affect, Insight and Judgment appropriate.   EKG: defer    Quentin Mullingmanda Jasani Lengel 1:03 PM

## 2018-08-09 ENCOUNTER — Encounter: Payer: Self-pay | Admitting: Physician Assistant

## 2018-08-09 DIAGNOSIS — N6019 Diffuse cystic mastopathy of unspecified breast: Secondary | ICD-10-CM

## 2018-08-09 DIAGNOSIS — E559 Vitamin D deficiency, unspecified: Secondary | ICD-10-CM

## 2018-08-09 DIAGNOSIS — T7840XD Allergy, unspecified, subsequent encounter: Secondary | ICD-10-CM

## 2018-08-09 DIAGNOSIS — E781 Pure hyperglyceridemia: Secondary | ICD-10-CM

## 2018-08-09 DIAGNOSIS — Z0001 Encounter for general adult medical examination with abnormal findings: Principal | ICD-10-CM

## 2018-08-09 DIAGNOSIS — F419 Anxiety disorder, unspecified: Secondary | ICD-10-CM

## 2018-08-09 DIAGNOSIS — E2839 Other primary ovarian failure: Secondary | ICD-10-CM

## 2018-08-09 DIAGNOSIS — F325 Major depressive disorder, single episode, in full remission: Secondary | ICD-10-CM

## 2018-08-09 DIAGNOSIS — Z79899 Other long term (current) drug therapy: Secondary | ICD-10-CM

## 2018-08-09 DIAGNOSIS — Z1389 Encounter for screening for other disorder: Secondary | ICD-10-CM

## 2018-10-18 ENCOUNTER — Other Ambulatory Visit: Payer: Self-pay | Admitting: Physician Assistant

## 2018-10-18 MED ORDER — SERTRALINE HCL 50 MG PO TABS: ORAL_TABLET | ORAL | 1 refills | Status: DC

## 2018-10-18 NOTE — Progress Notes (Signed)
Complete Physical  Assessment and Plan:  Anxiety continue medications, stress management techniques discussed, increase water, good sleep hygiene discussed, increase exercise, and increase veggies.    Depression Continue zoloft and supplements Doing well   Vitamin D deficiency Continue supplement - Vit D  25 hydroxy (rtn osteoporosis monitoring)  Allergy, subsequent encounter Allergic rhinitis - Allegra OTC, increase H20, allergy hygiene explained.   Fibrocystic breast disease, unspecified laterality Self breast exams, get MGM 3D   Hypertriglyceridemia -continue medications, check lipids, decrease fatty foods, increase activity.  - Lipid panel - Microalbumin / creatinine urine ratio - Urinalysis, Routine w reflex microscopic   Encounter for general adult medical examination with abnormal findings Follow up  Medication management - CBC with Differential/Platelet - BASIC METABOLIC PANEL WITH GFR - Hepatic function panel - Magnesium   Health Maintenance Discussed med's effects and SE's. Screening labs and tests as requested with regular follow-up as recommended.  HPI 63 y.o. female  presents for a complete physical. Her blood pressure has been controlled at home, today their BP is BP: 118/70   She does workout, works out with trainer.  She denies chest pain, shortness of breath, dizziness.  She is not on cholesterol medication and denies myalgias. Her cholesterol is at goal. The cholesterol last visit was:   Lab Results  Component Value Date   CHOL 193 08/03/2017   HDL 63 08/03/2017   LDLCALC 102 (H) 08/03/2017   TRIG 161 (H) 08/03/2017   CHOLHDL 3.1 08/03/2017   Patient is on Vitamin D supplement, 2000 IU a day Lab Results  Component Value Date   VD25OH 41 08/03/2017   She is on zoloft for depression/anxiety which helps.  She is teaching wellness classes and grief counseling.   BMI is Body mass index is 25.37 kg/m., she is working on diet and exercise. She  is working out 2-3 days a week.  Wt Readings from Last 3 Encounters:  10/20/18 171 lb 12.8 oz (77.9 kg)  08/03/17 170 lb (77.1 kg)  07/29/16 173 lb 12.8 oz (78.8 kg)     Current Medications:  Current Outpatient Medications on File Prior to Visit  Medication Sig Dispense Refill  . B Complex Vitamins (B COMPLEX PO) Take by mouth daily.    . Cholecalciferol (VITAMIN D PO) Take 3,600 Int'l Units by mouth daily.    . Nutritional Supplements (GLA 250 PO) Take by mouth.    Marland Kitchen OVER THE COUNTER MEDICATION daily. VITALIZER STRIP    . OVER THE COUNTER MEDICATION daily. NUTRIFERON    . OVER THE COUNTER MEDICATION daily. OSTEOMATRIX    . OVER THE COUNTER MEDICATION daily. SOY PROTEIN DRINK IN A.M.    . OVER THE COUNTER MEDICATION     . sertraline (ZOLOFT) 50 MG tablet TAKE ONE TABLET BY MOUTH ONE TIME DAILY 90 tablet 1   No current facility-administered medications on file prior to visit.    Health Maintenance:   Immunization History  Administered Date(s) Administered  . Tdap 06/27/2013   Tetanus: 2015 Pneumovax: N/A Prevnar 13: N/A Flu vaccine: Declines Zostavax: N/A Pap: 2017 neg HPV, due 5 years MGM: 05/2018 at [place]: N/A, has not had, no fractures, non smoker, no fm hx.  Colonoscopy:  2018, Dr. Oletta Lamas states had last year will get records EGD: N/A  Medical History:  Past Medical History:  Diagnosis Date  . Allergy   . Anxiety   . Depression   . Endometriosis   . Fibrocystic breast disease   .  Vitamin D deficiency    Allergies Allergies  Allergen Reactions  . Sulfa Antibiotics     SURGICAL HISTORY She  has a past surgical history that includes LASIK and laparotomy. FAMILY HISTORY Her family history includes COPD in her father; Diabetes in her father; Heart disease in her father; Hypertension in her father and mother. SOCIAL HISTORY She  reports that she has never smoked. She has never used smokeless tobacco. She reports that she does not drink alcohol or use  drugs.  Review of Systems  Constitutional: Negative.   HENT: Negative.   Eyes: Negative.   Respiratory: Negative.   Cardiovascular: Negative.   Gastrointestinal: Negative.   Genitourinary: Negative.   Musculoskeletal: Negative.   Skin: Negative.  Negative for itching and rash.       Several seb keratosis all over back, would like frozen off.   Neurological: Negative.   Endo/Heme/Allergies: Negative.   Psychiatric/Behavioral: Negative.     Physical Exam: Estimated body mass index is 25.37 kg/m as calculated from the following:   Height as of this encounter: 5\' 9"  (1.753 m).   Weight as of this encounter: 171 lb 12.8 oz (77.9 kg). BP 118/70   Pulse 75   Temp 98.1 F (36.7 C)   Ht 5\' 9"  (1.753 m)   Wt 171 lb 12.8 oz (77.9 kg)   SpO2 97%   BMI 25.37 kg/m  General Appearance: Well nourished, in no apparent distress. Eyes: PERRLA, EOMs, conjunctiva no swelling or erythema, normal fundi and vessels. Sinuses: No Frontal/maxillary tenderness ENT/Mouth: Ext aud canals clear, normal light reflex with TMs without erythema, bulging.  Good dentition. No erythema, swelling, or exudate on post pharynx. Tonsils not swollen or erythematous. Hearing normal.  Neck: Supple, thyroid normal. No bruits Respiratory: Respiratory effort normal, BS equal bilaterally without rales, rhonchi, wheezing or stridor. Cardio: RRR without murmurs, rubs or gallops. Brisk peripheral pulses without edema.  Chest: symmetric, with normal excursions and percussion. Breasts: defer Abdomen: Soft, +BS. Non tender, no guarding, rebound, hernias, masses, or organomegaly. .  Lymphatics: Non tender without lymphadenopathy.  Genitourinary: defer Musculoskeletal: Full ROM all peripheral extremities,5/5 strength, and normal gait. Skin: Several seb keratosis on back, chest. Warm, dry without rashes,ecchymosis.  Neuro: Cranial nerves intact, reflexes equal bilaterally. Normal muscle tone, no cerebellar symptoms. Sensation  intact.  Psych: Awake and oriented X 3, normal affect, Insight and Judgment appropriate.   EKG: WNL no ST changes    Quentin MullingAmanda Jmari Pelc 3:41 PM

## 2018-10-20 ENCOUNTER — Other Ambulatory Visit: Payer: Self-pay

## 2018-10-20 ENCOUNTER — Encounter: Payer: Self-pay | Admitting: Physician Assistant

## 2018-10-20 ENCOUNTER — Ambulatory Visit: Payer: PRIVATE HEALTH INSURANCE | Admitting: Physician Assistant

## 2018-10-20 VITALS — BP 118/70 | HR 75 | Temp 98.1°F | Ht 69.0 in | Wt 171.8 lb

## 2018-10-20 DIAGNOSIS — Z79899 Other long term (current) drug therapy: Secondary | ICD-10-CM | POA: Diagnosis not present

## 2018-10-20 DIAGNOSIS — N6019 Diffuse cystic mastopathy of unspecified breast: Secondary | ICD-10-CM

## 2018-10-20 DIAGNOSIS — Z0000 Encounter for general adult medical examination without abnormal findings: Secondary | ICD-10-CM

## 2018-10-20 DIAGNOSIS — Z6825 Body mass index (BMI) 25.0-25.9, adult: Secondary | ICD-10-CM

## 2018-10-20 DIAGNOSIS — Z136 Encounter for screening for cardiovascular disorders: Secondary | ICD-10-CM | POA: Diagnosis not present

## 2018-10-20 DIAGNOSIS — E559 Vitamin D deficiency, unspecified: Secondary | ICD-10-CM | POA: Diagnosis not present

## 2018-10-20 DIAGNOSIS — T7840XD Allergy, unspecified, subsequent encounter: Secondary | ICD-10-CM

## 2018-10-20 DIAGNOSIS — Z0001 Encounter for general adult medical examination with abnormal findings: Secondary | ICD-10-CM

## 2018-10-20 DIAGNOSIS — F419 Anxiety disorder, unspecified: Secondary | ICD-10-CM

## 2018-10-20 DIAGNOSIS — I10 Essential (primary) hypertension: Secondary | ICD-10-CM | POA: Diagnosis not present

## 2018-10-20 DIAGNOSIS — Z13220 Encounter for screening for lipoid disorders: Secondary | ICD-10-CM

## 2018-10-20 DIAGNOSIS — F325 Major depressive disorder, single episode, in full remission: Secondary | ICD-10-CM

## 2018-10-20 DIAGNOSIS — Z1389 Encounter for screening for other disorder: Secondary | ICD-10-CM | POA: Diagnosis not present

## 2018-10-20 DIAGNOSIS — E2839 Other primary ovarian failure: Secondary | ICD-10-CM

## 2018-10-20 DIAGNOSIS — E781 Pure hyperglyceridemia: Secondary | ICD-10-CM

## 2018-10-21 LAB — COMPLETE METABOLIC PANEL WITH GFR
AG Ratio: 1.8 (calc) (ref 1.0–2.5)
ALT: 19 U/L (ref 6–29)
AST: 26 U/L (ref 10–35)
Albumin: 4.5 g/dL (ref 3.6–5.1)
Alkaline phosphatase (APISO): 52 U/L (ref 37–153)
BUN: 11 mg/dL (ref 7–25)
CO2: 28 mmol/L (ref 20–32)
Calcium: 9.9 mg/dL (ref 8.6–10.4)
Chloride: 101 mmol/L (ref 98–110)
Creat: 0.96 mg/dL (ref 0.50–0.99)
GFR, Est African American: 73 mL/min/{1.73_m2} (ref >=60)
GFR, Est Non African American: 63 mL/min/{1.73_m2} (ref >=60)
Globulin: 2.5 g/dL (calc) (ref 1.9–3.7)
Glucose, Bld: 85 mg/dL (ref 65–99)
Potassium: 4.3 mmol/L (ref 3.5–5.3)
Sodium: 137 mmol/L (ref 135–146)
Total Bilirubin: 1 mg/dL (ref 0.2–1.2)
Total Protein: 7 g/dL (ref 6.1–8.1)

## 2018-10-21 LAB — CBC WITH DIFFERENTIAL/PLATELET
Absolute Monocytes: 490 cells/uL (ref 200–950)
Basophils Absolute: 61 cells/uL (ref 0–200)
Basophils Relative: 1.2 %
Eosinophils Absolute: 112 cells/uL (ref 15–500)
Eosinophils Relative: 2.2 %
HCT: 39.8 % (ref 35.0–45.0)
Hemoglobin: 13.3 g/dL (ref 11.7–15.5)
Lymphs Abs: 1346 cells/uL (ref 850–3900)
MCH: 30.2 pg (ref 27.0–33.0)
MCHC: 33.4 g/dL (ref 32.0–36.0)
MCV: 90.2 fL (ref 80.0–100.0)
MPV: 11.2 fL (ref 7.5–12.5)
Monocytes Relative: 9.6 %
Neutro Abs: 3091 cells/uL (ref 1500–7800)
Neutrophils Relative %: 60.6 %
Platelets: 231 10*3/uL (ref 140–400)
RBC: 4.41 10*6/uL (ref 3.80–5.10)
RDW: 12.1 % (ref 11.0–15.0)
Total Lymphocyte: 26.4 %
WBC: 5.1 10*3/uL (ref 3.8–10.8)

## 2018-10-21 LAB — LIPID PANEL
Cholesterol: 206 mg/dL — ABNORMAL HIGH (ref <200)
HDL: 61 mg/dL (ref >=50)
LDL Cholesterol (Calc): 124 mg/dL (calc) — ABNORMAL HIGH
Non-HDL Cholesterol (Calc): 145 mg/dL (calc) — ABNORMAL HIGH (ref <130)
Total CHOL/HDL Ratio: 3.4 (calc) (ref <5.0)
Triglycerides: 107 mg/dL (ref <150)

## 2018-10-21 LAB — URINALYSIS, ROUTINE W REFLEX MICROSCOPIC
Bacteria, UA: NONE SEEN /HPF
Bilirubin Urine: NEGATIVE
Glucose, UA: NEGATIVE
Hgb urine dipstick: NEGATIVE
Hyaline Cast: NONE SEEN /LPF
Ketones, ur: NEGATIVE
Nitrite: NEGATIVE
Protein, ur: NEGATIVE
RBC / HPF: NONE SEEN /HPF (ref 0–2)
Specific Gravity, Urine: 1.007 (ref 1.001–1.03)
Squamous Epithelial / LPF: NONE SEEN /HPF (ref <=5)
pH: >=8.5 — AB (ref 5.0–8.0)

## 2018-10-21 LAB — MAGNESIUM: Magnesium: 2 mg/dL (ref 1.5–2.5)

## 2018-10-21 LAB — MICROALBUMIN / CREATININE URINE RATIO
Creatinine, Urine: 35 mg/dL (ref 20–275)
Microalb Creat Ratio: 9 mcg/mg creat (ref <30)
Microalb, Ur: 0.3 mg/dL

## 2018-10-21 LAB — VITAMIN D 25 HYDROXY (VIT D DEFICIENCY, FRACTURES): Vit D, 25-Hydroxy: 48 ng/mL (ref 30–100)

## 2018-10-21 LAB — TSH: TSH: 0.85 mIU/L (ref 0.40–4.50)

## 2019-02-09 ENCOUNTER — Other Ambulatory Visit: Payer: Self-pay

## 2019-02-09 DIAGNOSIS — Z20822 Contact with and (suspected) exposure to covid-19: Secondary | ICD-10-CM

## 2019-02-12 LAB — NOVEL CORONAVIRUS, NAA: SARS-CoV-2, NAA: NOT DETECTED

## 2019-04-18 ENCOUNTER — Ambulatory Visit: Payer: BLUE CROSS/BLUE SHIELD | Attending: Internal Medicine

## 2019-04-18 DIAGNOSIS — Z20822 Contact with and (suspected) exposure to covid-19: Secondary | ICD-10-CM | POA: Insufficient documentation

## 2019-04-20 LAB — NOVEL CORONAVIRUS, NAA: SARS-CoV-2, NAA: NOT DETECTED

## 2019-04-25 ENCOUNTER — Other Ambulatory Visit: Payer: Self-pay

## 2019-04-25 MED ORDER — SERTRALINE HCL 50 MG PO TABS: ORAL_TABLET | ORAL | 1 refills | Status: DC

## 2019-06-13 LAB — HM MAMMOGRAPHY

## 2019-06-14 ENCOUNTER — Encounter: Payer: Self-pay | Admitting: *Deleted

## 2019-08-16 ENCOUNTER — Encounter: Payer: PRIVATE HEALTH INSURANCE | Admitting: Physician Assistant

## 2019-10-19 NOTE — Progress Notes (Signed)
Complete Physical  Assessment and Plan:  Anxiety continue medications, stress management techniques discussed, increase water, good sleep hygiene discussed, increase exercise, and increase veggies.    Depression Continue zoloft and supplements Doing well   Vitamin D deficiency Continue supplement - Vit D  25 hydroxy (rtn osteoporosis monitoring)  Allergy, subsequent encounter Allergic rhinitis - Allegra OTC, increase H20, allergy hygiene explained.   Fibrocystic breast disease, unspecified laterality Self breast exams, get MGM 3D   Hypertriglyceridemia -continue medications, check lipids, decrease fatty foods, increase activity.  - Lipid panel - Microalbumin / creatinine urine ratio - Urinalysis, Routine w reflex microscopic   Encounter for general adult medical examination with abnormal findings Follow up  Medication management - CBC with Differential/Platelet - BASIC METABOLIC PANEL WITH GFR - Hepatic function panel - Magnesium   Discussed med's effects and SE's. Screening labs and tests as requested with regular follow-up as recommended.  HPI 64 y.o. female  presents for a complete physical. Her blood pressure has been controlled at home, today their BP is BP: 110/70   She does workout, works out with Psychologist, educational.  She denies chest pain, shortness of breath, dizziness.  She is not on cholesterol medication. Dad with MI at 67 but was smoker/drinker, no other family history of MI, MGM with stroke in 84's.  Her cholesterol is at goal. The cholesterol last visit was:   Lab Results  Component Value Date   CHOL 206 (H) 10/20/2018   HDL 61 10/20/2018   LDLCALC 124 (H) 10/20/2018   TRIG 107 10/20/2018   CHOLHDL 3.4 10/20/2018   Patient is on Vitamin D supplement, 2000 IU a day Lab Results  Component Value Date   VD25OH 48 10/20/2018   She is on zoloft for depression/anxiety which helps.  She is teaching wellness classes and grief counseling.   BMI is Body mass  index is 25.1 kg/m., she is working on diet and exercise. She is working out 2-3 days a week.  Wt Readings from Last 3 Encounters:  10/24/19 170 lb (77.1 kg)  10/20/18 171 lb 12.8 oz (77.9 kg)  08/03/17 170 lb (77.1 kg)     Current Medications:  Current Outpatient Medications on File Prior to Visit  Medication Sig Dispense Refill   B Complex Vitamins (B COMPLEX PO) Take by mouth daily.     Cholecalciferol (VITAMIN D PO) Take 3,600 Int'l Units by mouth daily.     Nutritional Supplements (GLA 250 PO) Take by mouth.     OVER THE COUNTER MEDICATION daily. VITALIZER STRIP     OVER THE COUNTER MEDICATION daily. NUTRIFERON     OVER THE COUNTER MEDICATION daily. OSTEOMATRIX     OVER THE COUNTER MEDICATION daily. SOY PROTEIN DRINK IN A.M.     OVER THE COUNTER MEDICATION      No current facility-administered medications on file prior to visit.   Health Maintenance:   Immunization History  Administered Date(s) Administered   PFIZER SARS-COV-2 Vaccination 06/15/2019, 07/06/2019   Tdap 06/27/2013   Health Maintenance  Topic Date Due   INFLUENZA VACCINE  10/30/2019   MAMMOGRAM  06/12/2020   PAP SMEAR-Modifier  07/10/2020   TETANUS/TDAP  06/28/2023   COLONOSCOPY  01/20/2027   COVID-19 Vaccine  Completed   Hepatitis C Screening  Completed   HIV Screening  Completed    Pap: 2017 neg HPV, due 5 years MGM: 05/2019 at [facility] DEXA: N/A, has not had, no fractures, non smoker, mom and sister have osteoporosis- may get baseline  pending insurance Colonoscopy:  2018, Dr. Randa Evens states had last year will get records EGD: N/A  Medical History:  Past Medical History:  Diagnosis Date   Allergy    Anxiety    Depression    Endometriosis    Fibrocystic breast disease    Vitamin D deficiency    Allergies Allergies  Allergen Reactions   Sulfa Antibiotics     SURGICAL HISTORY She  has a past surgical history that includes LASIK and laparotomy. FAMILY HISTORY Her  family history includes COPD in her father; Diabetes in her father; Heart disease in her father; Hypertension in her father and mother. SOCIAL HISTORY She  reports that she has never smoked. She has never used smokeless tobacco. She reports that she does not drink alcohol and does not use drugs.  Review of Systems  Constitutional: Negative.   HENT: Negative.   Eyes: Negative.   Respiratory: Negative.   Cardiovascular: Negative.   Gastrointestinal: Negative.   Genitourinary: Negative.   Musculoskeletal: Negative.   Skin: Negative.  Negative for itching and rash.       Several seb keratosis all over back, would like frozen off.   Neurological: Negative.   Endo/Heme/Allergies: Negative.   Psychiatric/Behavioral: Negative.     Physical Exam: Estimated body mass index is 25.1 kg/m as calculated from the following:   Height as of this encounter: 5\' 9"  (1.753 m).   Weight as of this encounter: 170 lb (77.1 kg). BP 110/70    Pulse 80    Temp 97.7 F (36.5 C)    Resp 14    Ht 5\' 9"  (1.753 m)    Wt 170 lb (77.1 kg)    SpO2 98%    BMI 25.10 kg/m  General Appearance: Well nourished, in no apparent distress. Eyes: PERRLA, EOMs, conjunctiva no swelling or erythema, normal fundi and vessels. Sinuses: No Frontal/maxillary tenderness ENT/Mouth: Ext aud canals clear, normal light reflex with TMs without erythema, bulging.  Good dentition. No erythema, swelling, or exudate on post pharynx. Tonsils not swollen or erythematous. Hearing normal.  Neck: Supple, thyroid normal. No bruits Respiratory: Respiratory effort normal, BS equal bilaterally without rales, rhonchi, wheezing or stridor. Cardio: RRR without murmurs, rubs or gallops. Brisk peripheral pulses without edema.  Chest: symmetric, with normal excursions and percussion. Breasts: defer Abdomen: Soft, +BS. Non tender, no guarding, rebound, hernias, masses, or organomegaly. .  Lymphatics: Non tender without lymphadenopathy.  Genitourinary:  defer Musculoskeletal: Full ROM all peripheral extremities,5/5 strength, and normal gait. Right medial ankle superior and anterior to medial malleolus with non tender mobile swelling about 6 cm Skin: Several seb keratosis on back, chest. Warm, dry without rashes,ecchymosis.  Neuro: Cranial nerves intact, reflexes equal bilaterally. Normal muscle tone, no cerebellar symptoms. Sensation intact.  Psych: Awake and oriented X 3, normal affect, Insight and Judgment appropriate.   EKG: defer    3:35 PM

## 2019-10-24 ENCOUNTER — Encounter: Payer: Self-pay | Admitting: Physician Assistant

## 2019-10-24 ENCOUNTER — Ambulatory Visit: Payer: PRIVATE HEALTH INSURANCE | Admitting: Physician Assistant

## 2019-10-24 ENCOUNTER — Other Ambulatory Visit: Payer: Self-pay

## 2019-10-24 VITALS — BP 110/70 | HR 80 | Temp 97.7°F | Resp 14 | Ht 69.0 in | Wt 170.0 lb

## 2019-10-24 DIAGNOSIS — Z0001 Encounter for general adult medical examination with abnormal findings: Secondary | ICD-10-CM

## 2019-10-24 DIAGNOSIS — F419 Anxiety disorder, unspecified: Secondary | ICD-10-CM

## 2019-10-24 DIAGNOSIS — Z0000 Encounter for general adult medical examination without abnormal findings: Secondary | ICD-10-CM

## 2019-10-24 DIAGNOSIS — E559 Vitamin D deficiency, unspecified: Secondary | ICD-10-CM | POA: Diagnosis not present

## 2019-10-24 DIAGNOSIS — Z13220 Encounter for screening for lipoid disorders: Secondary | ICD-10-CM | POA: Diagnosis not present

## 2019-10-24 DIAGNOSIS — N6019 Diffuse cystic mastopathy of unspecified breast: Secondary | ICD-10-CM

## 2019-10-24 DIAGNOSIS — T7840XD Allergy, unspecified, subsequent encounter: Secondary | ICD-10-CM

## 2019-10-24 DIAGNOSIS — E2839 Other primary ovarian failure: Secondary | ICD-10-CM

## 2019-10-24 DIAGNOSIS — Z79899 Other long term (current) drug therapy: Secondary | ICD-10-CM

## 2019-10-24 DIAGNOSIS — E781 Pure hyperglyceridemia: Secondary | ICD-10-CM

## 2019-10-24 DIAGNOSIS — F325 Major depressive disorder, single episode, in full remission: Secondary | ICD-10-CM

## 2019-10-24 LAB — COMPLETE METABOLIC PANEL WITH GFR
AG Ratio: 1.8 (calc) (ref 1.0–2.5)
ALT: 14 U/L (ref 6–29)
AST: 22 U/L (ref 10–35)
Albumin: 4.3 g/dL (ref 3.6–5.1)
Alkaline phosphatase (APISO): 53 U/L (ref 37–153)
BUN: 18 mg/dL (ref 7–25)
CO2: 31 mmol/L (ref 20–32)
Calcium: 9.3 mg/dL (ref 8.6–10.4)
Chloride: 105 mmol/L (ref 98–110)
Creat: 0.81 mg/dL (ref 0.50–0.99)
GFR, Est African American: 90 mL/min/{1.73_m2} (ref >=60)
GFR, Est Non African American: 77 mL/min/{1.73_m2} (ref >=60)
Globulin: 2.4 g/dL (calc) (ref 1.9–3.7)
Glucose, Bld: 60 mg/dL — ABNORMAL LOW (ref 65–99)
Potassium: 3.9 mmol/L (ref 3.5–5.3)
Sodium: 140 mmol/L (ref 135–146)
Total Bilirubin: 0.7 mg/dL (ref 0.2–1.2)
Total Protein: 6.7 g/dL (ref 6.1–8.1)

## 2019-10-24 LAB — CBC WITH DIFFERENTIAL/PLATELET
Absolute Monocytes: 603 cells/uL (ref 200–950)
Basophils Absolute: 47 cells/uL (ref 0–200)
Basophils Relative: 0.7 %
Eosinophils Absolute: 101 cells/uL (ref 15–500)
Eosinophils Relative: 1.5 %
HCT: 38.4 % (ref 35.0–45.0)
Hemoglobin: 12.9 g/dL (ref 11.7–15.5)
Lymphs Abs: 1635 cells/uL (ref 850–3900)
MCH: 30.7 pg (ref 27.0–33.0)
MCHC: 33.6 g/dL (ref 32.0–36.0)
MCV: 91.4 fL (ref 80.0–100.0)
MPV: 10.8 fL (ref 7.5–12.5)
Monocytes Relative: 9 %
Neutro Abs: 4315 cells/uL (ref 1500–7800)
Neutrophils Relative %: 64.4 %
Platelets: 221 10*3/uL (ref 140–400)
RBC: 4.2 10*6/uL (ref 3.80–5.10)
RDW: 12.6 % (ref 11.0–15.0)
Total Lymphocyte: 24.4 %
WBC: 6.7 10*3/uL (ref 3.8–10.8)

## 2019-10-24 LAB — TSH: TSH: 0.81 mIU/L (ref 0.40–4.50)

## 2019-10-24 LAB — LIPID PANEL
Cholesterol: 207 mg/dL — ABNORMAL HIGH (ref <200)
HDL: 63 mg/dL (ref >=50)
LDL Cholesterol (Calc): 119 mg/dL (calc) — ABNORMAL HIGH
Non-HDL Cholesterol (Calc): 144 mg/dL (calc) — ABNORMAL HIGH (ref <130)
Total CHOL/HDL Ratio: 3.3 (calc) (ref <5.0)
Triglycerides: 131 mg/dL (ref <150)

## 2019-10-24 LAB — VITAMIN D 25 HYDROXY (VIT D DEFICIENCY, FRACTURES): Vit D, 25-Hydroxy: 33 ng/mL (ref 30–100)

## 2019-10-24 LAB — MAGNESIUM: Magnesium: 2.1 mg/dL (ref 1.5–2.5)

## 2019-10-24 MED ORDER — SERTRALINE HCL 50 MG PO TABS: ORAL_TABLET | ORAL | 3 refills | Status: DC

## 2019-10-24 NOTE — Patient Instructions (Signed)
  Ask insurance and pharmacy about shingrix - it is a 2 part shot that we will not be getting in the office.   Suggest getting AFTER covid vaccines, have to wait at least a month This shot can make you feel bad due to such good immune response it can trigger some inflammation so take tylenol or aleve day of or day after and plan on resting.   Can go to https://www.cdc.gov/vaccines/vpd/shingles/public/shingrix/index.html for more information  Shingrix Vaccination  Two vaccines are licensed and recommended to prevent shingles in the [place] Zoster vaccine live (ZVL, Zostavax) has been in use since 2006. Recombinant zoster vaccine (RZV, Shingrix), has been in use since 2017 and is recommended by [employer] as the preferred shingles vaccine.  What Everyone Should Know about Shingles Vaccine (Shingrix) One of the Recommended Vaccines by Disease Shingles vaccination is the only way to protect against shingles and postherpetic neuralgia (PHN), the most common complication from shingles. CDC recommends that healthy adults 50 years and older get two doses of the shingles vaccine called Shingrix (recombinant zoster vaccine), separated by 2 to 6 months, to prevent shingles and the complications from the disease. Your doctor or pharmacist can give you Shingrix as a shot in your upper arm. Shingrix provides strong protection against shingles and PHN. Two doses of Shingrix is more than 90% effective at preventing shingles and PHN. Protection stays above 85% for at least the first four years after you get vaccinated. Shingrix is the preferred vaccine, over Zostavax (zoster vaccine live), a shingles vaccine in use since 2006. Zostavax may still be used to prevent shingles in healthy adults 60 years and older. For example, you could use Zostavax if a person is allergic to Shingrix, prefers Zostavax, or requests immediate vaccination and Shingrix is unavailable. Who Should Get Shingrix? Healthy adults 50 years and older  should get two doses of Shingrix, separated by 2 to 6 months. You should get Shingrix even if in the past you . had shingles  . received Zostavax  . are not sure if you had chickenpox There is no maximum age for getting Shingrix. If you had shingles in the past, you can get Shingrix to help prevent future occurrences of the disease. There is no specific length of time that you need to wait after having shingles before you can receive Shingrix, but generally you should make sure the shingles rash has gone away before getting vaccinated. You can get Shingrix whether or not you remember having had chickenpox in the past. Studies show that more than 99% of Americans 40 years and older have had chickenpox, even if they don't remember having the disease. Chickenpox and shingles are related because they are caused by the same virus (varicella zoster virus). After a person recovers from chickenpox, the virus stays dormant (inactive) in the body. It can reactivate years later and cause shingles. If you had Zostavax in the recent past, you should wait at least eight weeks before getting Shingrix. Talk to your healthcare provider to determine the best time to get Shingrix. Shingrix is available in doctor's offices and pharmacies. To find doctor's offices or pharmacies near you that offer the vaccine, visit Health[facility] Vaccine FinderExternal. If you have questions about Shingrix, talk with your healthcare provider. Vaccine for Those 50 Years and Older  Shingrix reduces the risk of shingles and PHN by more than 90% in people 50 and older. CDC recommends the vaccine for healthy adults 50 and older.  Who Should Not Get Shingrix?   You should not get Shingrix if you: . have ever had a severe allergic reaction to any component of the vaccine or after a dose of Shingrix  . tested negative for immunity to varicella zoster virus. If you test negative, you should get chickenpox vaccine.  . currently have shingles   . currently are pregnant or breastfeeding. Women who are pregnant or breastfeeding should wait to get Shingrix.  . receive specific antiviral drugs (acyclovir, famciclovir, or valacyclovir) 24 hours before vaccination (avoid use of these antiviral drugs for 14 days after vaccination)- zoster vaccine live only If you have a minor acute (starts suddenly) illness, such as a cold, you may get Shingrix. But if you have a moderate or severe acute illness, you should usually wait until you recover before getting the vaccine. This includes anyone with a temperature of 101.3F or higher. The side effects of the Shingrix are temporary, and usually last 2 to 3 days. While you may experience pain for a few days after getting Shingrix, the pain will be less severe than having shingles and the complications from the disease. How Well Does Shingrix Work? Two doses of Shingrix provides strong protection against shingles and postherpetic neuralgia (PHN), the most common complication of shingles. . In adults 50 to 63 years old who got two doses, Shingrix was 97% effective in preventing shingles; among adults 70 years and older, Shingrix was 91% effective.  . In adults 50 to 63 years old who got two doses, Shingrix was 91% effective in preventing PHN; among adults 70 years and older, Shingrix was 89% effective. Shingrix protection remained high (more than 85%) in people 70 years and older throughout the four years following vaccination. Since your risk of shingles and PHN increases as you get older, it is important to have strong protection against shingles in your older years. Top of Page  What Are the Possible Side Effects of Shingrix? Studies show that Shingrix is safe. The vaccine helps your body create a strong defense against shingles. As a result, you are likely to have temporary side effects from getting the shots. The side effects may affect your ability to do normal daily activities for 2 to 3 days. Most people  got a sore arm with mild or moderate pain after getting Shingrix, and some also had redness and swelling where they got the shot. Some people felt tired, had muscle pain, a headache, shivering, fever, stomach pain, or nausea. About 1 out of 6 people who got Shingrix experienced side effects that prevented them from doing regular activities. Symptoms went away on their own in about 2 to 3 days. Side effects were more common in younger people. You might have a reaction to the first or second dose of Shingrix, or both doses. If you experience side effects, you may choose to take over-the-counter pain medicine such as ibuprofen or acetaminophen. If you experience side effects from Shingrix, you should report them to the Vaccine Adverse Event Reporting System (VAERS). Your doctor might file this report, or you can do it yourself through the VAERS websiteExternal, or by calling 1-800-822-7967. If you have any questions about side effects from Shingrix, talk with your doctor. The shingles vaccine does not contain thimerosal (a preservative containing mercury). Top of Page  When Should I See a Doctor Because of the Side Effects I Experience From Shingrix? In clinical trials, Shingrix was not associated with serious adverse events. In fact, serious side effects from vaccines are extremely rare. For example, for every   1 million doses of a vaccine given, only one or two people may have a severe allergic reaction. Signs of an allergic reaction happen within minutes or hours after vaccination and include hives, swelling of the face and throat, difficulty breathing, a fast heartbeat, dizziness, or weakness. If you experience these or any other life-threatening symptoms, see a doctor right away. Shingrix causes a strong response in your immune system, so it may produce short-term side effects more intense than you are used to from other vaccines. These side effects can be uncomfortable, but they are expected and usually go  away on their own in 2 or 3 days. Top of Page  How Can I Pay For Shingrix? There are several ways shingles vaccine may be paid for: Medicare . Medicare Part D plans cover the shingles vaccine, but there may be a cost to you depending on your plan. There may be a copay for the vaccine, or you may need to pay in full then get reimbursed for a certain amount.  . Medicare Part B does not cover the shingles vaccine. Medicaid . Medicaid may or may not cover the vaccine. Contact your insurer to find out. Private health insurance . Many private health insurance plans will cover the vaccine, but there may be a cost to you depending on your plan. Contact your insurer to find out. Vaccine assistance programs . Some pharmaceutical companies provide vaccines to eligible adults who cannot afford them. You may want to check with the vaccine manufacturer, [employer], about Shingrix. If you do not currently have health insurance, learn more about affordable health coverage optionsExternal. To find doctor's offices or pharmacies near you that offer the vaccine, visit HealthMap Vaccine FinderExternal.  

## 2020-04-13 ENCOUNTER — Other Ambulatory Visit: Payer: BLUE CROSS/BLUE SHIELD

## 2020-04-13 DIAGNOSIS — Z20822 Contact with and (suspected) exposure to covid-19: Secondary | ICD-10-CM

## 2020-04-18 LAB — NOVEL CORONAVIRUS, NAA: SARS-CoV-2, NAA: NOT DETECTED

## 2020-06-18 LAB — HM DEXA SCAN: HM Dexa Scan: NORMAL

## 2020-06-18 LAB — HM MAMMOGRAPHY

## 2020-06-29 ENCOUNTER — Encounter: Payer: Self-pay | Admitting: *Deleted

## 2020-07-02 ENCOUNTER — Encounter: Payer: Self-pay | Admitting: *Deleted

## 2020-10-16 ENCOUNTER — Other Ambulatory Visit: Payer: Self-pay

## 2020-10-16 MED ORDER — SERTRALINE HCL 50 MG PO TABS: ORAL_TABLET | ORAL | 3 refills | Status: DC

## 2020-10-23 ENCOUNTER — Encounter: Payer: PRIVATE HEALTH INSURANCE | Admitting: Nurse Practitioner

## 2020-10-23 ENCOUNTER — Encounter: Payer: PRIVATE HEALTH INSURANCE | Admitting: Adult Health Nurse Practitioner

## 2020-10-26 NOTE — Progress Notes (Signed)
Complete Physical  Assessment and Plan:  Colleen Alexander was seen today for annual exam.  Diagnoses and all orders for this visit:  Encounter for general adult medical examination with abnormal findings -     CBC with Differential/Platelet -     COMPLETE METABOLIC PANEL WITH GFR -     Lipid panel -     TSH -     Hemoglobin A1c -     VITAMIN D 25 Hydroxy (Vit-D Deficiency, Fractures) -     Magnesium -     EKG 12-Lead -     Urinalysis, Routine w reflex microscopic -     Microalbumin / creatinine urine ratio - Due annually  Hypertriglyceridemia -     COMPLETE METABOLIC PANEL WITH GFR -     Lipid panel - Continue diet modifications, encouraged exercise  Fibrocystic breast disease (FCBD), unspecified laterality       - Mammogram normal 05/2020, continue monthly breast exams and limiting caffeine  Depression, major, in remission (HCC)       - Doing well without medication.  Continue behavior modifications  Vitamin D deficiency -     VITAMIN D 25 Hydroxy (Vit-D Deficiency, Fractures)  Medication management -     CBC with Differential/Platelet -     COMPLETE METABOLIC PANEL WITH GFR -     Lipid panel -     TSH -     Hemoglobin A1c -     VITAMIN D 25 Hydroxy (Vit-D Deficiency, Fractures) -     Magnesium -     EKG 12-Lead -     Urinalysis, Routine w reflex microscopic -     Microalbumin / creatinine urine ratio  Screening for thyroid disorder -     TSH  Screening, anemia, deficiency, iron -     CBC with Differential/Platelet  Screening for cardiovascular condition -     EKG 12-Lead  Screening for blood or protein in urine -     Urinalysis, Routine w reflex microscopic -     Microalbumin / creatinine urine ratio  Screening for diabetes mellitus -     Hemoglobin A1c  Screening for cervical cancer       -     PAP, TP IMAGING, WNL RFLX HPV  Seborrheic Keratoses  Pt will schedule follow up with dermatology for removal.  Discussed med's effects and SE's. Screening labs and  tests as requested with regular follow-up as recommended.  HPI 65 y.o. female  presents for a complete physical. She has history of hypertriglyceridemia, Anxiety, Depression, Fibrocystic breast disease, Vitamin D deficiency  Her blood pressure has been controlled at home, today their BP is BP: 110/70   She does workout, works out with Psychologist, educational.  She denies chest pain, shortness of breath, dizziness.  She is not on cholesterol medication. Dad with MI at 22 but was smoker/drinker, no other family history of MI, MGM with stroke in 63's.  Her cholesterol is at goal. The cholesterol last visit was:   Lab Results  Component Value Date   CHOL 207 (H) 10/24/2019   HDL 63 10/24/2019   LDLCALC 119 (H) 10/24/2019   TRIG 131 10/24/2019   CHOLHDL 3.3 10/24/2019   BMI is Body mass index is 25.78 kg/m., she has been working on diet and exercise. Wt Readings from Last 3 Encounters:  10/29/20 174 lb 9.6 oz (79.2 kg)  10/24/19 170 lb (77.1 kg)  10/20/18 171 lb 12.8 oz (77.9 kg)     Patient is  on Vitamin D supplement, 2000 IU a day Lab Results  Component Value Date   VD25OH 33 10/24/2019   She is on zoloft for depression/anxiety which helps.  She is teaching wellness classes and grief counseling.     Current Medications:  Current Outpatient Medications on File Prior to Visit  Medication Sig Dispense Refill   B Complex Vitamins (B COMPLEX PO) Take by mouth daily.     Cholecalciferol (VITAMIN D PO) Take 3,600 Int'l Units by mouth daily.     Nutritional Supplements (GLA 250 PO) Take by mouth.     OVER THE COUNTER MEDICATION daily. VITALIZER STRIP     OVER THE COUNTER MEDICATION daily. NUTRIFERON     OVER THE COUNTER MEDICATION daily. OSTEOMATRIX     OVER THE COUNTER MEDICATION daily. SOY PROTEIN DRINK IN A.M.     OVER THE COUNTER MEDICATION      sertraline (ZOLOFT) 50 MG tablet TAKE ONE TABLET BY MOUTH ONE TIME DAILY 90 tablet 3   No current facility-administered medications on file prior  to visit.   Health Maintenance:   Immunization History  Administered Date(s) Administered   PFIZER(Purple Top)SARS-COV-2 Vaccination 06/15/2019, 07/06/2019   Tdap 06/27/2013   Health Maintenance  Topic Date Due   Pneumococcal Vaccine 43-69 Years old (1 - PCV) Never done   Zoster Vaccines- Shingrix (1 of 2) Never done   COVID-19 Vaccine (3 - Pfizer risk series) 08/03/2019   PAP SMEAR-Modifier  07/10/2020   INFLUENZA VACCINE  10/29/2020   MAMMOGRAM  06/18/2021   TETANUS/TDAP  06/28/2023   COLONOSCOPY (Pts 45-20yrs Insurance coverage will need to be confirmed)  01/20/2027   Hepatitis C Screening  Completed   HIV Screening  Completed   HPV VACCINES  Aged Out    Pap: 2017 neg HPV, due today if normal should not need another Pap MGM: 06/18/20 Benign at [facility], repeat 1 year DEXA: 06/18/20 normal Colonoscopy:  12/2016, Dr. Randa Evens internal hemorrhoids repeat 10 years due 2028  Eye Doctor: [facility] 07/2020 Dentist: Dr. Delane Ginger 09/2020  Medical History:  Past Medical History:  Diagnosis Date   Allergy    Anxiety    Depression    Endometriosis    Fibrocystic breast disease    Vitamin D deficiency    Allergies Allergies  Allergen Reactions   Sulfa Antibiotics     SURGICAL HISTORY She  has a past surgical history that includes LASIK and laparotomy. FAMILY HISTORY Her family history includes COPD in her father; Diabetes in her father; Heart disease in her father; Hypertension in her father and mother. SOCIAL HISTORY She  reports that she has never smoked. She has never used smokeless tobacco. She reports that she does not drink alcohol and does not use drugs.  Review of Systems  Constitutional: Negative.  Negative for chills and fever.  HENT: Negative.  Negative for congestion, hearing loss, sinus pain, sore throat and tinnitus.   Eyes: Negative.  Negative for blurred vision and double vision.  Respiratory: Negative.  Negative for cough, hemoptysis, sputum  production, shortness of breath and wheezing.   Cardiovascular: Negative.  Negative for chest pain, palpitations and leg swelling.  Gastrointestinal: Negative.  Negative for abdominal pain, constipation, diarrhea, heartburn, nausea and vomiting.  Genitourinary: Negative.  Negative for dysuria and urgency.  Musculoskeletal: Negative.  Negative for back pain, falls, joint pain, myalgias and neck pain.  Skin: Negative.  Negative for itching and rash.       Seborrheic keratoses, many on  back and chest  Neurological: Negative.  Negative for dizziness, tingling, tremors, weakness and headaches.  Endo/Heme/Allergies: Negative.  Does not bruise/bleed easily.  Psychiatric/Behavioral: Negative.  Negative for depression and suicidal ideas. The patient is not nervous/anxious and does not have insomnia.    Physical Exam: Estimated body mass index is 25.78 kg/m as calculated from the following:   Height as of this encounter: 5\' 9"  (1.753 m).   Weight as of this encounter: 174 lb 9.6 oz (79.2 kg). BP 110/70   Pulse 68   Temp 97.9 F (36.6 C)   Ht 5\' 9"  (1.753 m)   Wt 174 lb 9.6 oz (79.2 kg)   SpO2 97%   BMI 25.78 kg/m  General Appearance: Well nourished, in no apparent distress. Eyes: PERRLA, EOMs, conjunctiva no swelling or erythema, normal fundi and vessels. Sinuses: No Frontal/maxillary tenderness ENT/Mouth: Ext aud canals clear, normal light reflex with TMs without erythema, bulging.  Good dentition. No erythema, swelling, or exudate on post pharynx. Tonsils not swollen or erythematous. Hearing normal.  Neck: Supple, thyroid normal. No bruits Respiratory: Respiratory effort normal, BS equal bilaterally without rales, rhonchi, wheezing or stridor. Cardio: RRR without murmurs, rubs or gallops. Brisk peripheral pulses without edema.  Chest: symmetric, with normal excursions and percussion. Breasts: breasts appear normal, no suspicious masses, no skin or nipple changes or axillary nodes.  Abdomen:  Soft, +BS. Non tender, no guarding, rebound, hernias, masses, or organomegaly. .  Lymphatics: Non tender without lymphadenopathy.  Genitourinary: Normal external genitalia.  Normal-appearing vaginal vault and cervix.  Endocervical & exocervical Papanicolaou sample obtained.  Bimanual exam without abnormality.  Musculoskeletal: Full ROM all peripheral extremities,5/5 strength, and normal gait. Right medial ankle superior and anterior to medial malleolus with non tender mobile swelling about 6 cm Skin: Several seb keratosis on back, chest. Warm, dry without rashes,ecchymosis.  Neuro: Cranial nerves intact, reflexes equal bilaterally. Normal muscle tone, no cerebellar symptoms. Sensation intact.  Psych: Awake and oriented X 3, normal affect, Insight and Judgment appropriate.   EKG:  Normal sinus rhythm, no ST changes    [facility]  10/29/2020

## 2020-10-29 ENCOUNTER — Ambulatory Visit (INDEPENDENT_AMBULATORY_CARE_PROVIDER_SITE_OTHER): Payer: PRIVATE HEALTH INSURANCE | Admitting: Nurse Practitioner

## 2020-10-29 ENCOUNTER — Other Ambulatory Visit: Payer: Self-pay

## 2020-10-29 ENCOUNTER — Encounter: Payer: Self-pay | Admitting: Nurse Practitioner

## 2020-10-29 VITALS — BP 110/70 | HR 68 | Temp 97.9°F | Ht 69.0 in | Wt 174.6 lb

## 2020-10-29 DIAGNOSIS — Z136 Encounter for screening for cardiovascular disorders: Secondary | ICD-10-CM

## 2020-10-29 DIAGNOSIS — E559 Vitamin D deficiency, unspecified: Secondary | ICD-10-CM | POA: Diagnosis not present

## 2020-10-29 DIAGNOSIS — N6019 Diffuse cystic mastopathy of unspecified breast: Secondary | ICD-10-CM

## 2020-10-29 DIAGNOSIS — I10 Essential (primary) hypertension: Secondary | ICD-10-CM

## 2020-10-29 DIAGNOSIS — Z1389 Encounter for screening for other disorder: Secondary | ICD-10-CM

## 2020-10-29 DIAGNOSIS — Z124 Encounter for screening for malignant neoplasm of cervix: Secondary | ICD-10-CM

## 2020-10-29 DIAGNOSIS — Z79899 Other long term (current) drug therapy: Secondary | ICD-10-CM

## 2020-10-29 DIAGNOSIS — Z0000 Encounter for general adult medical examination without abnormal findings: Secondary | ICD-10-CM

## 2020-10-29 DIAGNOSIS — F419 Anxiety disorder, unspecified: Secondary | ICD-10-CM

## 2020-10-29 DIAGNOSIS — Z1329 Encounter for screening for other suspected endocrine disorder: Secondary | ICD-10-CM

## 2020-10-29 DIAGNOSIS — E781 Pure hyperglyceridemia: Secondary | ICD-10-CM

## 2020-10-29 DIAGNOSIS — L821 Other seborrheic keratosis: Secondary | ICD-10-CM

## 2020-10-29 DIAGNOSIS — Z13220 Encounter for screening for lipoid disorders: Secondary | ICD-10-CM | POA: Diagnosis not present

## 2020-10-29 DIAGNOSIS — Z0001 Encounter for general adult medical examination with abnormal findings: Secondary | ICD-10-CM

## 2020-10-29 DIAGNOSIS — Z131 Encounter for screening for diabetes mellitus: Secondary | ICD-10-CM | POA: Diagnosis not present

## 2020-10-29 DIAGNOSIS — Z130 Encounter for screening for diseases of the blood and blood-forming organs and certain disorders involving the immune mechanism: Secondary | ICD-10-CM

## 2020-10-29 DIAGNOSIS — F325 Major depressive disorder, single episode, in full remission: Secondary | ICD-10-CM

## 2020-10-29 NOTE — Patient Instructions (Signed)

## 2020-10-30 LAB — COMPLETE METABOLIC PANEL WITH GFR
AG Ratio: 1.9 (calc) (ref 1.0–2.5)
ALT: 17 U/L (ref 6–29)
AST: 22 U/L (ref 10–35)
Albumin: 4.4 g/dL (ref 3.6–5.1)
Alkaline phosphatase (APISO): 50 U/L (ref 37–153)
BUN: 18 mg/dL (ref 7–25)
CO2: 29 mmol/L (ref 20–32)
Calcium: 9.8 mg/dL (ref 8.6–10.4)
Chloride: 105 mmol/L (ref 98–110)
Creat: 0.85 mg/dL (ref 0.50–1.05)
Globulin: 2.3 g/dL (calc) (ref 1.9–3.7)
Glucose, Bld: 82 mg/dL (ref 65–99)
Potassium: 4.3 mmol/L (ref 3.5–5.3)
Sodium: 142 mmol/L (ref 135–146)
Total Bilirubin: 1 mg/dL (ref 0.2–1.2)
Total Protein: 6.7 g/dL (ref 6.1–8.1)
eGFR: 76 mL/min/{1.73_m2} (ref >=60)

## 2020-10-30 LAB — TSH: TSH: 1.16 mIU/L (ref 0.40–4.50)

## 2020-10-30 LAB — CBC WITH DIFFERENTIAL/PLATELET
Absolute Monocytes: 535 cells/uL (ref 200–950)
Basophils Absolute: 59 cells/uL (ref 0–200)
Basophils Relative: 0.9 %
Eosinophils Absolute: 92 cells/uL (ref 15–500)
Eosinophils Relative: 1.4 %
HCT: 40.2 % (ref 35.0–45.0)
Hemoglobin: 13.2 g/dL (ref 11.7–15.5)
Lymphs Abs: 1465 cells/uL (ref 850–3900)
MCH: 29.7 pg (ref 27.0–33.0)
MCHC: 32.8 g/dL (ref 32.0–36.0)
MCV: 90.5 fL (ref 80.0–100.0)
MPV: 11 fL (ref 7.5–12.5)
Monocytes Relative: 8.1 %
Neutro Abs: 4448 cells/uL (ref 1500–7800)
Neutrophils Relative %: 67.4 %
Platelets: 203 10*3/uL (ref 140–400)
RBC: 4.44 10*6/uL (ref 3.80–5.10)
RDW: 12.3 % (ref 11.0–15.0)
Total Lymphocyte: 22.2 %
WBC: 6.6 10*3/uL (ref 3.8–10.8)

## 2020-10-30 LAB — URINALYSIS, ROUTINE W REFLEX MICROSCOPIC
Bilirubin Urine: NEGATIVE
Glucose, UA: NEGATIVE
Hgb urine dipstick: NEGATIVE
Ketones, ur: NEGATIVE
Leukocytes,Ua: NEGATIVE
Nitrite: NEGATIVE
Protein, ur: NEGATIVE
Specific Gravity, Urine: 1.022 (ref 1.001–1.035)
pH: 5.5 (ref 5.0–8.0)

## 2020-10-30 LAB — LIPID PANEL
Cholesterol: 215 mg/dL — ABNORMAL HIGH (ref <200)
HDL: 71 mg/dL (ref >=50)
LDL Cholesterol (Calc): 125 mg/dL (calc) — ABNORMAL HIGH
Non-HDL Cholesterol (Calc): 144 mg/dL (calc) — ABNORMAL HIGH (ref <130)
Total CHOL/HDL Ratio: 3 (calc) (ref <5.0)
Triglycerides: 93 mg/dL (ref <150)

## 2020-10-30 LAB — HEMOGLOBIN A1C
Hgb A1c MFr Bld: 5 % of total Hgb (ref <5.7)
Mean Plasma Glucose: 97 mg/dL
eAG (mmol/L): 5.4 mmol/L

## 2020-10-30 LAB — MAGNESIUM: Magnesium: 1.9 mg/dL (ref 1.5–2.5)

## 2020-10-30 LAB — MICROALBUMIN / CREATININE URINE RATIO
Creatinine, Urine: 121 mg/dL (ref 20–275)
Microalb Creat Ratio: 2 mcg/mg creat (ref <30)
Microalb, Ur: 0.2 mg/dL

## 2020-10-30 LAB — VITAMIN D 25 HYDROXY (VIT D DEFICIENCY, FRACTURES): Vit D, 25-Hydroxy: 44 ng/mL (ref 30–100)

## 2020-10-31 LAB — PAP, TP IMAGING W/ HPV RNA, RFLX HPV TYPE 16,18/45: HPV DNA High Risk: NOT DETECTED

## 2020-10-31 LAB — PAP, TP IMAGING, WNL RFLX HPV

## 2020-11-19 ENCOUNTER — Ambulatory Visit (INDEPENDENT_AMBULATORY_CARE_PROVIDER_SITE_OTHER): Payer: No Typology Code available for payment source | Admitting: Nurse Practitioner

## 2020-11-19 ENCOUNTER — Encounter: Payer: Self-pay | Admitting: Nurse Practitioner

## 2020-11-19 ENCOUNTER — Other Ambulatory Visit: Payer: Self-pay

## 2020-11-19 VITALS — BP 110/70 | HR 77 | Temp 98.5°F | Wt 175.0 lb

## 2020-11-19 DIAGNOSIS — Z1152 Encounter for screening for COVID-19: Secondary | ICD-10-CM | POA: Diagnosis not present

## 2020-11-19 DIAGNOSIS — J029 Acute pharyngitis, unspecified: Secondary | ICD-10-CM

## 2020-11-19 LAB — POC COVID19 BINAXNOW: SARS Coronavirus 2 Ag: NEGATIVE

## 2020-11-19 MED ORDER — BENZONATATE 100 MG PO CAPS: 200.0000 mg | ORAL_CAPSULE | Freq: Three times a day (TID) | ORAL | 0 refills | Status: DC | PRN

## 2020-11-19 MED ORDER — DEXAMETHASONE 1 MG PO TABS: ORAL_TABLET | ORAL | 0 refills | Status: DC

## 2020-11-19 NOTE — Patient Instructions (Addendum)
Use steroid taper as directed Tessalon Perles 1-2 caps three times a day as needed for cough Mucinex DM to help with congestion and relieve post nasal drip.   Dexamethasone tablets What is this medication? DEXAMETHASONE (dex a METH a sone) is a corticosteroid. It is commonly used to treat inflammation of the skin, joints, lungs, and other organs. Common conditions treated include asthma, allergies, and arthritis. It is also used for other conditions, such as blood disorders and diseases of the adrenalglands. This medicine may be used for other purposes; ask your health care provider orpharmacist if you have questions. COMMON BRAND NAME(S): [employer]NO[employer]IC, Decadron, Dexabliss, DexPak Jr TaperPak, DexPak TaperPak, Dxevo, Hemady, HiDex, TaperDex, ZCORT, Zema-Pak,ZoDex, ZonaCort 11 Day, ZonaCort 7 Day What should I tell my care team before I take this medication? They need to know if you have any of these conditions: Cushing's syndrome diabetes glaucoma heart disease high blood pressure infection like herpes, measles, tuberculosis, or chickenpox kidney disease liver disease mental illness myasthenia gravis osteoporosis previous heart attack seizures stomach or intestine problems thyroid disease an unusual or allergic reaction to dexamethasone, corticosteroids, other medicines, lactose, foods, dyes, or preservatives pregnant or trying to get pregnant breast-feeding How should I use this medication? Take this medicine by mouth with a drink of water. Follow the directions on the prescription label. Take it with food or milk to avoid stomach upset. If you are taking this medicine once a day, take it in the morning. Do not take more medicine than you are told to take. Do not suddenly stop taking your medicine because you may develop a severe reaction. Your doctor will tell you how much medicine to take. If your doctor wants you to stop the medicine, the dose maybe slowly lowered  over time to avoid any side effects. Talk to your pediatrician regarding the use of this medicine in children.Special care may be needed. Patients over 64 years old may have a stronger reaction and need a smaller dose. Overdosage: If you think you have taken too much of this medicine contact apoison control center or emergency room at once. NOTE: This medicine is only for you. Do not share this medicine with others. What if I miss a dose? If you miss a dose, take it as soon as you can. If it is almost time for your next dose, talk to your doctor or health care professional. You may need to miss a dose or take an extra dose. Do not take double or extra doses withoutadvice. What may interact with this medication? Do not take this medicine with any of the following medications: live virus vaccines This medicine may also interact with the following medications: aminoglutethimide amphotericin B aspirin and aspirin-like medicines certain antibiotics like erythromycin, clarithromycin, and troleandomycin certain antivirals for HIV or hepatitis certain medicines for seizures like carbamazepine, phenobarbital, phenytoin certain medicines to treat myasthenia gravis cholestyramine cyclosporine digoxin diuretics ephedrine female hormones, like estrogen or progestins and birth control pills insulin or other medicines for diabetes isoniazid ketoconazole medicines that relax muscles for surgery mifepristone NSAIDs, medicines for pain and inflammation, like ibuprofen or naproxen rifampin skin tests for allergies thalidomide vaccines warfarin This list may not describe all possible interactions. Give your health care provider a list of all the medicines, herbs, non-prescription drugs, or dietary supplements you use. Also tell them if you smoke, drink alcohol, or use illegaldrugs. Some items may interact with your medicine. What should I watch for while using this medication? Visit your health  care  professional for regular checks on your progress. Tell your health care professional if your symptoms do not start to get better or if they get worse. Your condition will be monitored carefully while you arereceiving this medicine. Wear a medical ID bracelet or chain. Carry a card that describes your diseaseand details of your medicine and dosage times. This medicine may increase your risk of getting an infection. Call your health care professional for advice if you get a fever, chills, or sore throat, or other symptoms of a cold or flu. Do not treat yourself. Try to avoid being around people who are sick. Call your health care professional if you are around anyone with measles, chickenpox, or if you develop sores or blistersthat do not heal properly. If you are going to need surgery or other procedures, tell your doctor or health care professional that you have taken this medicine within the last 76months. Ask your doctor or health care professional about your diet. You may need tolower the amount of salt you eat. This medicine may increase blood sugar. Ask your healthcare provider if changesin diet or medicines are needed if you have diabetes. What side effects may I notice from receiving this medication? Side effects that you should report to your doctor or health care professionalas soon as possible: allergic reactions like skin rash, itching or hives, swelling of the face, lips, or tongue bloody or black, tarry stools changes in emotions or moods changes in vision confusion, excitement, restlessness depressed mood eye pain hallucinations fever or chills, cough, sore throat, pain or difficulty passing urine muscle weakness severe or sudden stomach or belly pain signs and symptoms of high blood sugar such as being more thirsty or hungry or having to urinate more than normal. You may also feel very tired or have blurry vision. signs and symptoms of infection like fever; chills; cough; sore  throat; pain or trouble passing urine swelling of ankles, feet unusual bruising or bleeding wounds that do not heal Side effects that usually do not require medical attention (report to yourdoctor or health care professional if they continue or are bothersome): increased appetite increased growth of face or body hair headache nausea, vomiting skin problems, acne, thin and shiny skin trouble sleeping weight gain This list may not describe all possible side effects. Call your doctor for medical advice about side effects. You may report side effects to FDA at1-800-FDA-1088. Where should I keep my medication? Keep out of the reach of children. Store at room temperature between 20 and 25 degrees C (68 and 77 degrees F).Protect from light. Throw away any unused medicine after the expiration date. NOTE: This sheet is a summary. It may not cover all possible information. If you have questions about this medicine, talk to your doctor, pharmacist, orhealth care provider.  2022 Elsevier/Gold Standard (2018-09-28 14:23:34)

## 2020-11-19 NOTE — Progress Notes (Signed)
Assessment and Plan:  Zoe was seen today for covid screening.  Diagnoses and all orders for this visit:  Encounter for screening for COVID-19 -     POC COVID-19  Acute pharyngitis, unspecified etiology        -     dexamethasone (DECADRON) 1 MG tablet; Take 3 tabs for 3 days, 2 tabs for 3 days 1 tab for 5     days. Take with food. -     benzonatate (TESSALON PERLES) 100 MG capsule; Take 2 capsules (200 mg total) by mouth 3 (three) times daily as needed for cough (Max: 600mg  per day).      Further disposition pending results of labs. Discussed med's effects and SE's.   Over 30 minutes of exam, counseling, chart review, and critical decision making was performed.   Future Appointments  Date Time Provider Department Center  10/29/2021  2:00 PM 12/29/2021, NP [facility] None    ------------------------------------------------------------------------------------------------------------------   HPI BP 110/70   Pulse 77   Temp 98.5 F (36.9 C)   Wt 175 lb (79.4 kg)   SpO2 98%   BMI 25.84 kg/m  65 y.o.female presents for 1 week ago started  with sore throat , then developed laryngitis and a cough which has been productive of clear mucus. Her voice has returned and cough is lessening but the sore throat persists. Denies fever, chills, myalgias, and shortness of breath.  Past Medical History:  Diagnosis Date   Allergy    Anxiety    Depression    Endometriosis    Fibrocystic breast disease    Vitamin D deficiency      Allergies  Allergen Reactions   Sulfa Antibiotics     Current Outpatient Medications on File Prior to Visit  Medication Sig   B Complex Vitamins (B COMPLEX PO) Take by mouth daily.   Cholecalciferol (VITAMIN D PO) Take 3,600 Int'l Units by mouth daily.   Nutritional Supplements (GLA 250 PO) Take by mouth.   OVER THE COUNTER MEDICATION daily. VITALIZER STRIP   OVER THE COUNTER MEDICATION daily. NUTRIFERON   OVER THE COUNTER MEDICATION daily. OSTEOMATRIX    OVER THE COUNTER MEDICATION daily. SOY PROTEIN DRINK IN A.M.   OVER THE COUNTER MEDICATION    sertraline (ZOLOFT) 50 MG tablet TAKE ONE TABLET BY MOUTH ONE TIME DAILY   No current facility-administered medications on file prior to visit.    ROS: all negative except above.   Physical Exam:  BP 110/70   Pulse 77   Temp 98.5 F (36.9 C)   Wt 175 lb (79.4 kg)   SpO2 98%   BMI 25.84 kg/m   General Appearance: Well nourished, in no apparent distress. Eyes: PERRLA, EOMs, conjunctiva no swelling or erythema Sinuses: No Frontal/maxillary tenderness ENT/Mouth: Ext aud canals clear, TMs without erythema, bulging. Slight erythema post phaynx with slear post nasal drip note, tonsils slightly enlarged. Hearing normal.  Neck: Supple, thyroid normal.  Respiratory: Respiratory effort normal, BS equal bilaterally without rales, rhonchi, wheezing or stridor.  Cardio: RRR with no MRGs. Brisk peripheral pulses without edema.  Abdomen: Soft, + BS.  Non tender, no guarding, rebound, hernias, masses. Lymphatics: lymphadenopathy superficial anterior cervical on left, slightly tender.   Musculoskeletal: Full ROM, 5/5 strength, normal gait.  Skin: Warm, dry without rashes, lesions, ecchymosis.  Neuro: Cranial nerves intact. Normal muscle tone, no cerebellar symptoms. Sensation intact.  Psych: Awake and oriented X 3, normal affect, Insight and Judgment appropriate.  Revonda Humphrey, NP 9:54 AM [facility]

## 2020-11-22 LAB — POCT RAPID STREP A (OFFICE): Rapid Strep A Screen: NEGATIVE

## 2020-11-22 NOTE — Addendum Note (Signed)
Addended by: Adria Dill on: 11/22/2020 09:41 AM   Modules accepted: Orders

## 2020-11-22 NOTE — Addendum Note (Signed)
Addended by: Revonda Humphrey on: 11/22/2020 10:01 AM   Modules accepted: Orders

## 2020-11-22 NOTE — Addendum Note (Signed)
Addended by: Adria Dill on: 11/22/2020 09:35 AM   Modules accepted: Orders

## 2020-11-22 NOTE — Addendum Note (Signed)
Addended by: Dionicio Stall on: 11/22/2020 09:47 AM   Modules accepted: Orders

## 2020-12-26 NOTE — Progress Notes (Signed)
Assessment and Plan:  Colleen Alexander was seen today for urinary tract infection.  Diagnoses and all orders for this visit:  Dysuria -     nitrofurantoin, macrocrystal-monohydrate, (MACROBID) 100 MG capsule; Take 1 capsule (100 mg total) by mouth 2 (two) times daily for 7 days. -     phenazopyridine (PYRIDIUM) 100 MG tablet; Take 1 tablet (100 mg total) by mouth 3 (three) times daily as needed for up to 3 days for pain. -     Urinalysis, Routine w reflex microscopic -     Urine Culture   Monitor symptoms, if no improvement or begins to have back pain or fever call the office    Further disposition pending results of labs. Discussed med's effects and SE's.   Over 30 minutes of exam, counseling, chart review, and critical decision making was performed.   Future Appointments  Date Time Provider Department Center  10/29/2021  2:00 PM Revonda Humphrey, NP [facility] None    ------------------------------------------------------------------------------------------------------------------   HPI BP 114/66   Pulse 87   Temp (!) 97.5 F (36.4 C)   Wt 175 lb 9.6 oz (79.7 kg)   SpO2 97%   BMI 25.93 kg/m  65 y.o.female presents for frequent urination  Patient has noticed since 2 days ago she has been experiencing feeling of bladder fullness and frequent urination. Some dysuria.  No blood in urine.  Has not changed soap, laundry detergent. Denies back pain.   Past Medical History:  Diagnosis Date   Allergy    Anxiety    Depression    Endometriosis    Fibrocystic breast disease    Vitamin D deficiency      Allergies  Allergen Reactions   Sulfa Antibiotics     Current Outpatient Medications on File Prior to Visit  Medication Sig   B Complex Vitamins (B COMPLEX PO) Take by mouth daily.   Cholecalciferol (VITAMIN D PO) Take 3,600 Int'l Units by mouth daily.   Nutritional Supplements (GLA 250 PO) Take by mouth.   OVER THE COUNTER MEDICATION daily. VITALIZER STRIP   OVER THE COUNTER  MEDICATION daily. NUTRIFERON   OVER THE COUNTER MEDICATION daily. OSTEOMATRIX   OVER THE COUNTER MEDICATION daily. SOY PROTEIN DRINK IN A.M.   OVER THE COUNTER MEDICATION    sertraline (ZOLOFT) 50 MG tablet TAKE ONE TABLET BY MOUTH ONE TIME DAILY   benzonatate (TESSALON PERLES) 100 MG capsule Take 2 capsules (200 mg total) by mouth 3 (three) times daily as needed for cough (Max: 600mg  per day). (Patient not taking: Reported on 12/27/2020)   dexamethasone (DECADRON) 1 MG tablet Take 3 tabs for 3 days, 2 tabs for 3 days 1 tab for 5 days. Take with food. (Patient not taking: Reported on 12/27/2020)   No current facility-administered medications on file prior to visit.    ROS: all negative except above.   Physical Exam:  BP 114/66   Pulse 87   Temp (!) 97.5 F (36.4 C)   Wt 175 lb 9.6 oz (79.7 kg)   SpO2 97%   BMI 25.93 kg/m   General Appearance: Well nourished, in no apparent distress. Eyes: PERRLA, EOMs, conjunctiva no swelling or erythema Sinuses: No Frontal/maxillary tenderness ENT/Mouth: Ext aud canals clear, TMs without erythema, bulging. No erythema, swelling, or exudate on post pharynx.  Tonsils not swollen or erythematous. Hearing normal.  Neck: Supple, thyroid normal.  Respiratory: Respiratory effort normal, BS equal bilaterally without rales, rhonchi, wheezing or stridor.  Cardio: RRR with no MRGs. Brisk  peripheral pulses without edema.  Abdomen: Soft, + BS.  Non tender, no guarding, rebound, hernias, masses. Lymphatics: Non tender without lymphadenopathy.  Musculoskeletal: Full ROM, 5/5 strength, normal gait.  Skin: Warm, dry without rashes, lesions, ecchymosis.  Neuro: Cranial nerves intact. Normal muscle tone, no cerebellar symptoms. Sensation intact.  Psych: Awake and oriented X 3, normal affect, Insight and Judgment appropriate.     Revonda Humphrey, NP 10:08 AM [facility]

## 2020-12-27 ENCOUNTER — Encounter: Payer: Self-pay | Admitting: Nurse Practitioner

## 2020-12-27 ENCOUNTER — Ambulatory Visit: Payer: No Typology Code available for payment source | Admitting: Nurse Practitioner

## 2020-12-27 ENCOUNTER — Other Ambulatory Visit: Payer: Self-pay

## 2020-12-27 VITALS — BP 114/66 | HR 87 | Temp 97.5°F | Wt 175.6 lb

## 2020-12-27 DIAGNOSIS — R300 Dysuria: Secondary | ICD-10-CM

## 2020-12-27 MED ORDER — PHENAZOPYRIDINE HCL 100 MG PO TABS
100.0000 mg | ORAL_TABLET | Freq: Three times a day (TID) | ORAL | 0 refills | Status: AC | PRN
Start: 2020-12-27 — End: 2020-12-30

## 2020-12-27 MED ORDER — NITROFURANTOIN MONOHYD MACRO 100 MG PO CAPS: 100.0000 mg | ORAL_CAPSULE | Freq: Two times a day (BID) | ORAL | 0 refills | Status: AC

## 2020-12-27 NOTE — Patient Instructions (Signed)
Urinary Tract Infection, Adult A urinary tract infection (UTI) is an infection of any part of the urinary tract. The urinary tract includes the kidneys, ureters, bladder, and urethra. These organs make, store, and get rid of urine in the body. An upper UTI affects the ureters and kidneys. A lower UTI affects the bladder and urethra. What are the causes? Most urinary tract infections are caused by bacteria in your genital area around your urethra, where urine leaves your body. These bacteria grow and cause inflammation of your urinary tract. What increases the risk? You are more likely to develop this condition if: You have a urinary catheter that stays in place. You are not able to control when you urinate or have a bowel movement (incontinence). You are female and you: Use a spermicide or diaphragm for birth control. Have low estrogen levels. Are pregnant. You have certain genes that increase your risk. You are sexually active. You take antibiotic medicines. You have a condition that causes your flow of urine to slow down, such as: An enlarged prostate, if you are female. Blockage in your urethra. A kidney stone. A nerve condition that affects your bladder control (neurogenic bladder). Not getting enough to drink, or not urinating often. You have certain medical conditions, such as: Diabetes. A weak disease-fighting system (immunesystem). Sickle cell disease. Gout. Spinal cord injury. What are the signs or symptoms? Symptoms of this condition include: Needing to urinate right away (urgency). Frequent urination. This may include small amounts of urine each time you urinate. Pain or burning with urination. Blood in the urine. Urine that smells bad or unusual. Trouble urinating. Cloudy urine. Vaginal discharge, if you are female. Pain in the abdomen or the lower back. You may also have: Vomiting or a decreased appetite. Confusion. Irritability or tiredness. A fever or  chills. Diarrhea. The first symptom in older adults may be confusion. In some cases, they may not have any symptoms until the infection has worsened. How is this diagnosed? This condition is diagnosed based on your medical history and a physical exam. You may also have other tests, including: Urine tests. Blood tests. Tests for STIs (sexually transmitted infections). If you have had more than one UTI, a cystoscopy or imaging studies may be done to determine the cause of the infections. How is this treated? Treatment for this condition includes: Antibiotic medicine. Over-the-counter medicines to treat discomfort. Drinking enough water to stay hydrated. If you have frequent infections or have other conditions such as a kidney stone, you may need to see a health care provider who specializes in the urinary tract (urologist). In rare cases, urinary tract infections can cause sepsis. Sepsis is a life-threatening condition that occurs when the body responds to an infection. Sepsis is treated in the hospital with IV antibiotics, fluids, and other medicines. Follow these instructions at home: Medicines Take over-the-counter and prescription medicines only as told by your health care provider. If you were prescribed an antibiotic medicine, take it as told by your health care provider. Do not stop using the antibiotic even if you start to feel better. General instructions Make sure you: Empty your bladder often and completely. Do not hold urine for long periods of time. Empty your bladder after sex. Wipe from front to back after urinating or having a bowel movement if you are female. Use each tissue only one time when you wipe. Drink enough fluid to keep your urine pale yellow. Keep all follow-up visits. This is important. Contact a health care provider   if: Your symptoms do not get better after 1-2 days. Your symptoms go away and then return. Get help right away if: You have severe pain in your  back or your lower abdomen. You have a fever or chills. You have nausea or vomiting. Summary A urinary tract infection (UTI) is an infection of any part of the urinary tract, which includes the kidneys, ureters, bladder, and urethra. Most urinary tract infections are caused by bacteria in your genital area. Treatment for this condition often includes antibiotic medicines. If you were prescribed an antibiotic medicine, take it as told by your health care provider. Do not stop using the antibiotic even if you start to feel better. Keep all follow-up visits. This is important. This information is not intended to replace advice given to you by your health care provider. Make sure you discuss any questions you have with your health care provider. Document Revised: 10/28/2019 Document Reviewed: 10/28/2019 Elsevier Patient Education  2022 [employer]  

## 2020-12-29 LAB — URINE CULTURE
MICRO NUMBER:: 12441114
SPECIMEN QUALITY:: ADEQUATE

## 2020-12-29 LAB — URINALYSIS, ROUTINE W REFLEX MICROSCOPIC
Bilirubin Urine: NEGATIVE
Glucose, UA: NEGATIVE
Hyaline Cast: NONE SEEN /LPF
Ketones, ur: NEGATIVE
Nitrite: NEGATIVE
RBC / HPF: >=60 /HPF — AB (ref 0–2)
Specific Gravity, Urine: 1.02 (ref 1.001–1.035)
Squamous Epithelial / HPF: NONE SEEN /HPF (ref <=5)
WBC, UA: >=60 /HPF — AB (ref 0–5)
pH: 6.5 (ref 5.0–8.0)

## 2020-12-31 ENCOUNTER — Other Ambulatory Visit: Payer: Self-pay | Admitting: Nurse Practitioner

## 2020-12-31 DIAGNOSIS — A498 Other bacterial infections of unspecified site: Secondary | ICD-10-CM

## 2020-12-31 MED ORDER — CIPROFLOXACIN HCL 500 MG PO TABS: 500.0000 mg | ORAL_TABLET | Freq: Two times a day (BID) | ORAL | 0 refills | Status: AC

## 2021-03-11 ENCOUNTER — Encounter: Payer: Self-pay | Admitting: Nurse Practitioner

## 2021-03-12 ENCOUNTER — Other Ambulatory Visit: Payer: Self-pay | Admitting: Nurse Practitioner

## 2021-03-12 MED ORDER — MELOXICAM 7.5 MG PO TABS: 7.5000 mg | ORAL_TABLET | Freq: Every day | ORAL | 2 refills | Status: AC

## 2021-08-12 LAB — HM MAMMOGRAPHY

## 2021-08-14 ENCOUNTER — Encounter: Payer: Self-pay | Admitting: Internal Medicine

## 2021-10-28 NOTE — Progress Notes (Unsigned)
Complete Physical  Assessment and Plan:  Colleen Alexander was seen today for annual exam.  Diagnoses and all orders for this visit:  Encounter for general adult medical examination with abnormal findings -     CBC with Differential/Platelet -     COMPLETE METABOLIC PANEL WITH GFR -     Lipid panel -     TSH -     VITAMIN D 25 Hydroxy (Vit-D Deficiency, Fractures) -     Magnesium -     EKG 12-Lead -     Urinalysis, Routine w reflex microscopic -     Microalbumin / creatinine urine ratio - Due annually  Hypertriglyceridemia -     COMPLETE METABOLIC PANEL WITH GFR -     Lipid panel - Continue diet modifications, encouraged exercise  Fibrocystic breast disease (FCBD), unspecified laterality       - Mammogram normal 07/2021, continue monthly breast exams and limiting caffeine  Depression, major, in remission (HCC)       - Doing well without medication.  Continue behavior modifications  Vitamin D deficiency Encouraged to use Vit D supplementation -     VITAMIN D 25 Hydroxy (Vit-D Deficiency, Fractures)  Medication management -     CBC with Differential/Platelet -     COMPLETE METABOLIC PANEL WITH GFR -     Lipid panel -     TSH -     VITAMIN D 25 Hydroxy (Vit-D Deficiency, Fractures) -     Magnesium -     EKG 12-Lead -     Urinalysis, Routine w reflex microscopic -     Microalbumin / creatinine urine ratio       Discussed med's effects and SE's. Screening labs and tests as requested with regular follow-up as recommended.  HPI 66 y.o. female  presents for a complete physical. She has history of hypertriglyceridemia, Anxiety, Depression, Fibrocystic breast disease, Vitamin D deficiency  Her blood pressure has been controlled at home, today their BP is BP: 104/60  > BP Readings from Last 3 Encounters:  10/29/21 104/60  12/27/20 114/66  11/19/20 110/70    Had 1 episode of bright red bleeding after having an episode of constipation. She felt a scratching sensation with BM.   Used preparation H and has resolved. Normal BM's and no further issues since then  She does workout, works out with trainer.  She denies chest pain, shortness of breath, dizziness.  She is not on cholesterol medication. Dad with MI at 39 but was smoker/drinker, no other family history of MI, MGM with stroke in 24's.  Her cholesterol is at goal. The cholesterol last visit was:   Lab Results  Component Value Date   CHOL 215 (H) 10/29/2020   HDL 71 10/29/2020   LDLCALC 125 (H) 10/29/2020   TRIG 93 10/29/2020   CHOLHDL 3.0 10/29/2020   BMI is Body mass index is 25.88 kg/m., she has been working on diet and exercise. She has been weighing 171 at home. She is walking consistently Wt Readings from Last 3 Encounters:  10/29/21 174 lb (78.9 kg)  12/27/20 175 lb 9.6 oz (79.7 kg)  11/19/20 175 lb (79.4 kg)     Patient is not on Vit D supplement Lab Results  Component Value Date   VD25OH 44 10/29/2020   She is on zoloft for depression/anxiety which helps.  She is teaching wellness classes and grief counseling.     Current Medications:  Current Outpatient Medications on File Prior to Visit  Medication Sig Dispense Refill   B Complex Vitamins (B COMPLEX PO) Take by mouth daily.     Cholecalciferol (VITAMIN D PO) Take 3,600 Int'l Units by mouth daily.     meloxicam (MOBIC) 7.5 MG tablet Take 1 tablet (7.5 mg total) by mouth daily. 30 tablet 2   OVER THE COUNTER MEDICATION daily. VITALIZER STRIP     OVER THE COUNTER MEDICATION daily. NUTRIFERON     OVER THE COUNTER MEDICATION daily. OSTEOMATRIX     OVER THE COUNTER MEDICATION daily. SOY PROTEIN DRINK IN A.M.     sertraline (ZOLOFT) 50 MG tablet TAKE ONE TABLET BY MOUTH ONE TIME DAILY 90 tablet 3   Nutritional Supplements (GLA 250 PO) Take by mouth. (Patient not taking: Reported on 10/29/2021)     OVER THE COUNTER MEDICATION  (Patient not taking: Reported on 10/29/2021)     No current facility-administered medications on file prior to visit.    Health Maintenance:   Immunization History  Administered Date(s) Administered   Influenza,inj,quad, With Preservative 02/04/2021   PFIZER(Purple Top)SARS-COV-2 Vaccination 06/15/2019, 07/06/2019   Pfizer Covid-19 Vaccine Bivalent Booster 40yrs & up 02/04/2021   Tdap 06/27/2013   Health Maintenance  Topic Date Due   Zoster Vaccines- Shingrix (1 of 2) Never done   COVID-19 Vaccine (4 - Booster for Pfizer series) 04/01/2021   Pneumonia Vaccine 47+ Years old (1 - PCV) Never done   INFLUENZA VACCINE  10/29/2021   MAMMOGRAM  08/13/2022   TETANUS/TDAP  06/28/2023   PAP SMEAR-Modifier  10/29/2025   COLONOSCOPY (Pts 45-33yrs Insurance coverage will need to be confirmed)  01/20/2027   DEXA SCAN  Completed   Hepatitis C Screening  Completed   HIV Screening  Completed   HPV VACCINES  Aged Out    Pap: 2022 Negative, needs no further MGM: 08/12/21 Negative repeat 1 year DEXA: 06/18/20 normal Colonoscopy:  12/2016, Dr. Randa Evens internal hemorrhoids repeat 10 years due 2028  Eye Doctor: [facility] 07/[facility] Dentist: Dr. Delane Ginger 09/2020  Medical History:  Past Medical History:  Diagnosis Date   Allergy    Anxiety    Depression    Endometriosis    Fibrocystic breast disease    Vitamin D deficiency    Allergies Allergies  Allergen Reactions   Sulfa Antibiotics     SURGICAL HISTORY She  has a past surgical history that includes LASIK and laparotomy. FAMILY HISTORY Her family history includes COPD in her father; Diabetes in her father; Heart disease in her father; Hypertension in her father and mother. SOCIAL HISTORY She  reports that she has never smoked. She has never used smokeless tobacco. She reports that she does not drink alcohol and does not use drugs.  Review of Systems  Constitutional: Negative.  Negative for chills and fever.  HENT: Negative.  Negative for congestion, hearing loss, sinus pain, sore throat and tinnitus.   Eyes: Negative.  Negative for blurred  vision and double vision.  Respiratory: Negative.  Negative for cough, hemoptysis, sputum production, shortness of breath and wheezing.   Cardiovascular: Negative.  Negative for chest pain, palpitations and leg swelling.  Gastrointestinal: Negative.  Negative for abdominal pain, constipation, diarrhea, heartburn, nausea and vomiting.  Genitourinary: Negative.  Negative for dysuria and urgency.  Musculoskeletal: Negative.  Negative for back pain, falls, joint pain, myalgias and neck pain.  Skin: Negative.  Negative for itching and rash.  Neurological: Negative.  Negative for dizziness, tingling, tremors, weakness and headaches.  Endo/Heme/Allergies: Negative.  Does not  bruise/bleed easily.  Psychiatric/Behavioral: Negative.  Negative for depression and suicidal ideas. The patient is not nervous/anxious and does not have insomnia.     Physical Exam: Estimated body mass index is 25.88 kg/m as calculated from the following:   Height as of this encounter: 5' 8.75" (1.746 m).   Weight as of this encounter: 174 lb (78.9 kg). BP 104/60   Pulse 80   Temp 97.9 F (36.6 C)   Ht 5' 8.75" (1.746 m)   Wt 174 lb (78.9 kg)   SpO2 97%   BMI 25.88 kg/m  General Appearance: Well nourished, in no apparent distress. Eyes: PERRLA, EOMs, conjunctiva no swelling or erythema, normal fundi and vessels. Sinuses: No Frontal/maxillary tenderness ENT/Mouth: Ext aud canals clear, normal light reflex with TMs without erythema, bulging.  Good dentition. No erythema, swelling, or exudate on post pharynx. Tonsils not swollen or erythematous. Hearing normal.  Neck: Supple, thyroid normal. No bruits Respiratory: Respiratory effort normal, BS equal bilaterally without rales, rhonchi, wheezing or stridor. Cardio: RRR without murmurs, rubs or gallops. Brisk peripheral pulses without edema.  Chest: symmetric, with normal excursions and percussion. Breasts:Defer , Gets mammograms yearly- WNL and UTD Abdomen: Soft, +BS. Non  tender, no guarding, rebound, hernias, masses, or organomegaly. .  Lymphatics: Non tender without lymphadenopathy.  Genitourinary: Defer, no issues Musculoskeletal: Full ROM all peripheral extremities,5/5 strength, and normal gait. Right medial ankle superior and anterior to medial malleolus with non tender mobile swelling about 6 cm Skin: Several seb keratosis on back, chest. Warm, dry without rashes,ecchymosis.  Neuro: Cranial nerves intact, reflexes equal bilaterally. Normal muscle tone, no cerebellar symptoms. Sensation intact.  Psych: Awake and oriented X 3, normal affect, Insight and Judgment appropriate.   EKG:  NSR, no ST changes    [facility]  10/29/2021

## 2021-10-29 ENCOUNTER — Encounter: Payer: Self-pay | Admitting: Nurse Practitioner

## 2021-10-29 ENCOUNTER — Ambulatory Visit (INDEPENDENT_AMBULATORY_CARE_PROVIDER_SITE_OTHER): Payer: Medicare Other | Admitting: Nurse Practitioner

## 2021-10-29 VITALS — BP 104/60 | HR 80 | Temp 97.9°F | Ht 68.75 in | Wt 174.0 lb

## 2021-10-29 DIAGNOSIS — E559 Vitamin D deficiency, unspecified: Secondary | ICD-10-CM

## 2021-10-29 DIAGNOSIS — Z131 Encounter for screening for diabetes mellitus: Secondary | ICD-10-CM

## 2021-10-29 DIAGNOSIS — E781 Pure hyperglyceridemia: Secondary | ICD-10-CM

## 2021-10-29 DIAGNOSIS — N6019 Diffuse cystic mastopathy of unspecified breast: Secondary | ICD-10-CM | POA: Diagnosis not present

## 2021-10-29 DIAGNOSIS — Z0001 Encounter for general adult medical examination with abnormal findings: Secondary | ICD-10-CM

## 2021-10-29 DIAGNOSIS — F325 Major depressive disorder, single episode, in full remission: Secondary | ICD-10-CM

## 2021-10-29 DIAGNOSIS — Z1329 Encounter for screening for other suspected endocrine disorder: Secondary | ICD-10-CM

## 2021-10-29 DIAGNOSIS — Z79899 Other long term (current) drug therapy: Secondary | ICD-10-CM

## 2021-10-29 DIAGNOSIS — I10 Essential (primary) hypertension: Secondary | ICD-10-CM | POA: Diagnosis not present

## 2021-10-29 DIAGNOSIS — Z1389 Encounter for screening for other disorder: Secondary | ICD-10-CM

## 2021-10-29 DIAGNOSIS — Z136 Encounter for screening for cardiovascular disorders: Secondary | ICD-10-CM | POA: Diagnosis not present

## 2021-10-30 LAB — CBC WITH DIFFERENTIAL/PLATELET
Absolute Monocytes: 383 cells/uL (ref 200–950)
Basophils Absolute: 59 cells/uL (ref 0–200)
Basophils Relative: 1.1 %
Eosinophils Absolute: 70 cells/uL (ref 15–500)
Eosinophils Relative: 1.3 %
HCT: 39.6 % (ref 35.0–45.0)
Hemoglobin: 13 g/dL (ref 11.7–15.5)
Lymphs Abs: 1226 cells/uL (ref 850–3900)
MCH: 29.8 pg (ref 27.0–33.0)
MCHC: 32.8 g/dL (ref 32.0–36.0)
MCV: 90.8 fL (ref 80.0–100.0)
MPV: 11.3 fL (ref 7.5–12.5)
Monocytes Relative: 7.1 %
Neutro Abs: 3661 cells/uL (ref 1500–7800)
Neutrophils Relative %: 67.8 %
Platelets: 208 10*3/uL (ref 140–400)
RBC: 4.36 10*6/uL (ref 3.80–5.10)
RDW: 12.7 % (ref 11.0–15.0)
Total Lymphocyte: 22.7 %
WBC: 5.4 10*3/uL (ref 3.8–10.8)

## 2021-10-30 LAB — COMPLETE METABOLIC PANEL WITH GFR
AG Ratio: 1.9 (calc) (ref 1.0–2.5)
ALT: 15 U/L (ref 6–29)
AST: 23 U/L (ref 10–35)
Albumin: 4.2 g/dL (ref 3.6–5.1)
Alkaline phosphatase (APISO): 55 U/L (ref 37–153)
BUN: 16 mg/dL (ref 7–25)
CO2: 30 mmol/L (ref 20–32)
Calcium: 9.5 mg/dL (ref 8.6–10.4)
Chloride: 104 mmol/L (ref 98–110)
Creat: 0.86 mg/dL (ref 0.50–1.05)
Globulin: 2.2 g/dL (calc) (ref 1.9–3.7)
Glucose, Bld: 101 mg/dL — ABNORMAL HIGH (ref 65–99)
Potassium: 3.7 mmol/L (ref 3.5–5.3)
Sodium: 140 mmol/L (ref 135–146)
Total Bilirubin: 0.6 mg/dL (ref 0.2–1.2)
Total Protein: 6.4 g/dL (ref 6.1–8.1)
eGFR: 75 mL/min/{1.73_m2} (ref >=60)

## 2021-10-30 LAB — URINALYSIS, ROUTINE W REFLEX MICROSCOPIC
Bilirubin Urine: NEGATIVE
Glucose, UA: NEGATIVE
Hgb urine dipstick: NEGATIVE
Ketones, ur: NEGATIVE
Leukocytes,Ua: NEGATIVE
Nitrite: NEGATIVE
Protein, ur: NEGATIVE
Specific Gravity, Urine: 1.012 (ref 1.001–1.035)
pH: 5.5 (ref 5.0–8.0)

## 2021-10-30 LAB — LIPID PANEL
Cholesterol: 209 mg/dL — ABNORMAL HIGH (ref <200)
HDL: 67 mg/dL (ref >=50)
LDL Cholesterol (Calc): 119 mg/dL (calc) — ABNORMAL HIGH
Non-HDL Cholesterol (Calc): 142 mg/dL (calc) — ABNORMAL HIGH (ref <130)
Total CHOL/HDL Ratio: 3.1 (calc) (ref <5.0)
Triglycerides: 119 mg/dL (ref <150)

## 2021-10-30 LAB — MICROALBUMIN / CREATININE URINE RATIO
Creatinine, Urine: 68 mg/dL (ref 20–275)
Microalb, Ur: <0.2 mg/dL

## 2021-10-30 LAB — VITAMIN D 25 HYDROXY (VIT D DEFICIENCY, FRACTURES): Vit D, 25-Hydroxy: 47 ng/mL (ref 30–100)

## 2021-10-30 LAB — MAGNESIUM: Magnesium: 1.9 mg/dL (ref 1.5–2.5)

## 2021-10-30 LAB — TSH: TSH: 1.09 mIU/L (ref 0.40–4.50)

## 2021-11-09 ENCOUNTER — Other Ambulatory Visit: Payer: Self-pay | Admitting: Nurse Practitioner

## 2022-04-10 NOTE — Progress Notes (Deleted)
Assessment and Plan:  There are no diagnoses linked to this encounter.    Further disposition pending results of labs. Discussed med's effects and SE's.   Over 30 minutes of exam, counseling, chart review, and critical decision making was performed.   Future Appointments  Date Time Provider [place]  04/11/2022  9:45 AM Alycia Rossetti, NP [facility] None  10/30/2022  2:00 PM Alycia Rossetti, NP [facility] None    ------------------------------------------------------------------------------------------------------------------   HPI There were no vitals taken for this visit. 67 y.o.female presents for  Past Medical History:  Diagnosis Date   Allergy    Anxiety    Depression    Endometriosis    Fibrocystic breast disease    Vitamin D deficiency      Allergies  Allergen Reactions   Sulfa Antibiotics     Current Outpatient Medications on File Prior to Visit  Medication Sig   B Complex Vitamins (B COMPLEX PO) Take by mouth daily.   Cholecalciferol (VITAMIN D PO) Take 3,600 Int'l Units by mouth daily.   OVER THE COUNTER MEDICATION daily. VITALIZER STRIP   OVER THE COUNTER MEDICATION daily. NUTRIFERON   OVER THE COUNTER MEDICATION daily. OSTEOMATRIX   OVER THE COUNTER MEDICATION daily. SOY PROTEIN DRINK IN A.M.   sertraline (ZOLOFT) 50 MG tablet Take  1 tablet  Daily  for Mood   No current facility-administered medications on file prior to visit.    ROS: all negative except above.   Physical Exam:  There were no vitals taken for this visit.  General Appearance: Well nourished, in no apparent distress. Eyes: PERRLA, EOMs, conjunctiva no swelling or erythema Sinuses: No Frontal/maxillary tenderness ENT/Mouth: Ext aud canals clear, TMs without erythema, bulging. No erythema, swelling, or exudate on post pharynx.  Tonsils not swollen or erythematous. Hearing normal.  Neck: Supple, thyroid normal.  Respiratory: Respiratory effort normal, BS equal  bilaterally without rales, rhonchi, wheezing or stridor.  Cardio: RRR with no MRGs. Brisk peripheral pulses without edema.  Abdomen: Soft, + BS.  Non tender, no guarding, rebound, hernias, masses. Lymphatics: Non tender without lymphadenopathy.  Musculoskeletal: Full ROM, 5/5 strength, normal gait.  Skin: Warm, dry without rashes, lesions, ecchymosis.  Neuro: Cranial nerves intact. Normal muscle tone, no cerebellar symptoms. Sensation intact.  Psych: Awake and oriented X 3, normal affect, Insight and Judgment appropriate.     Alycia Rossetti, NP 2:13 PM [facility]

## 2022-04-11 ENCOUNTER — Ambulatory Visit: Payer: Medicare Other | Admitting: Nurse Practitioner

## 2022-04-14 ENCOUNTER — Ambulatory Visit (INDEPENDENT_AMBULATORY_CARE_PROVIDER_SITE_OTHER): Payer: Medicare Other | Admitting: Nurse Practitioner

## 2022-04-14 VITALS — BP 112/74 | HR 71 | Temp 98.1°F | Ht 68.75 in | Wt 175.0 lb

## 2022-04-14 DIAGNOSIS — N644 Mastodynia: Secondary | ICD-10-CM | POA: Diagnosis not present

## 2022-04-14 DIAGNOSIS — N6019 Diffuse cystic mastopathy of unspecified breast: Secondary | ICD-10-CM | POA: Diagnosis not present

## 2022-04-14 NOTE — Progress Notes (Signed)
Assessment and Plan:  Colleen Alexander was seen today for an episodic visit.  Diagnoses and all order for this visit:  Breast pain, right Review of mammogram 07/2021 Negative No 1st degree relative with breast cancer. Breast exam performed with chaperone No significant finding other than fibrocystic breast tissue; negative.  Continue to monitor If new s/s contact office for updated mammogram.  Continue to monitor for breast changes including nipple discharge, new lumps, redness, swelling, intervened nipple, orange peeling type skin changes.   Fibrocystic breast disease (FCBD), unspecified laterality Continue yearly mammogram screening Continue self breast exam  Notify office for further evaluation and treatment, questions or concerns if s/s fail to improve. The risks and benefits of my recommendations, as well as other treatment options were discussed with the patient today. Questions were answered.  Further disposition pending results of labs. Discussed med's effects and SE's.    Over 20 minutes of exam, counseling, chart review, and critical decision making was performed.   Future Appointments  Date Time Provider [place]  10/30/2022  2:00 PM Alycia Rossetti, NP [facility] None    ------------------------------------------------------------------------------------------------------------------   HPI BP 112/74   Pulse 71   Temp 98.1 F (36.7 C)   Ht 5' 8.75" (1.746 m)   Wt 175 lb (79.4 kg)   SpO2 98%   BMI 26.03 kg/m   67 y.o.female presents for evaluation of right breast after feeling a painful area a few days ago which has no subsided.  She is unsure if this is r/t her hx of fibrocystic breasts or weight lifting.  She did not feel a hard nodule, just a flat area of pain that felt irregular in shape.  She denies nipple discharge, changes to nipple, skin changes along the breast and areola area. She denies fever, chills, malaise, fatigue.  Mammogram UTD 07/2021 Negative.   No significant family hx of breast cancer.   Past Medical History:  Diagnosis Date   Allergy    Anxiety    Depression    Endometriosis    Fibrocystic breast disease    Vitamin D deficiency      Allergies  Allergen Reactions   Sulfa Antibiotics     Current Outpatient Medications on File Prior to Visit  Medication Sig   B Complex Vitamins (B COMPLEX PO) Take by mouth daily.   Cholecalciferol (VITAMIN D PO) Take 3,600 Int'l Units by mouth daily.   OVER THE COUNTER MEDICATION daily. VITALIZER STRIP   OVER THE COUNTER MEDICATION daily. NUTRIFERON   OVER THE COUNTER MEDICATION daily. OSTEOMATRIX   OVER THE COUNTER MEDICATION daily. SOY PROTEIN DRINK IN A.M.   sertraline (ZOLOFT) 50 MG tablet Take  1 tablet  Daily  for Mood   No current facility-administered medications on file prior to visit.    ROS: all negative except what is noted in the HPI.   Physical Exam:  BP 112/74   Pulse 71   Temp 98.1 F (36.7 C)   Ht 5' 8.75" (1.746 m)   Wt 175 lb (79.4 kg)   SpO2 98%   BMI 26.03 kg/m   General Appearance: NAD.  Awake, conversant and cooperative. Eyes: PERRLA, EOMs intact.  Sclera white.  Conjunctiva without erythema. Sinuses: No frontal/maxillary tenderness.  No nasal discharge. Nares patent.  ENT/Mouth: Ext aud canals clear.  Bilateral TMs w/DOL and without erythema or bulging. Hearing intact.  Posterior pharynx without swelling or exudate.  Tonsils without swelling or erythema.  Neck: Supple.  No masses, nodules or  thyromegaly. Respiratory: Effort is regular with non-labored breathing. Breath sounds are equal bilaterally without rales, rhonchi, wheezing or stridor.  Cardio: RRR with no MRGs. Brisk peripheral pulses without edema.  Abdomen: Active BS in all four quadrants.  Soft and non-tender without guarding, rebound tenderness, hernias or masses. Lymphatics: Non tender without lymphadenopathy.  Musculoskeletal: Full ROM, 5/5 strength, normal ambulation.  No clubbing or  cyanosis. Skin: Appropriate color for ethnicity. Warm without rashes, lesions, ecchymosis, ulcers.  Neuro: CN II-XII grossly normal. Normal muscle tone without cerebellar symptoms and intact sensation.   Psych: AO X 3,  appropriate mood and affect, insight and judgment.  Breast:  Bilateral breast with flat, rope like palpation along breast tissue.  Tenderness to palpation just  right of the areola on the right breast.    Nipple everted.  No discharge.  No areas of the breast with erythema, sores, orange peel like skin.      Darrol Jump, NP 3:45 PM [facility]

## 2022-04-14 NOTE — Patient Instructions (Signed)
Fibrocystic Breast Changes  Fibrocystic breast changes are changes that can make your breasts swollen, lumpy, or painful. These changes happen when there is scar-like tissue (fibrous tissue) in the breasts or when tiny sacs of fluid (cysts) form in the breast. This is a common condition. It does not mean that you have cancer. It usually happens because of hormone changes during a monthly period. What are the causes? The exact cause of this condition is not known. However, you are more likely to have it: If you have high levels of female hormones. If your mother had the same condition (inherited). What are the signs or symptoms? Tenderness, swelling, mild discomfort, or pain. Rope-like tissue that can be felt when touching the breast. Lumps in one or both breasts. Changes in breast size. Breasts may get larger before your monthly period and smaller after your period. Discharge from the nipple. Symptoms are usually worse before a monthly period starts. They often get better toward the end of periods. How is this treated? Often, treatment is not needed for this condition. In some cases, you may need treatment, including: Taking medicines. Avoiding caffeine. Reducing the amount of sugar and fat in your diet. You may also have: A procedure to remove fluid from a cyst. Surgery to remove a cyst that is large or tender or does not go away. Medicines that may lower female hormones in the body. Follow these instructions at home: Self-care Check your breasts after every monthly period. If you do not have monthly periods, check your breasts on the first day of every month. Check for: Soreness. New swelling or new lumps. A change in breast size. A change in a lump that was already there. General instructions Take over-the-counter and prescription medicines only as told by your doctor. Wear a support or sports bra that fits well. Wear this support especially when you are exercising. If told by  your doctor, avoid or have less caffeine, fat, and sugar in what you eat and drink. Keep all follow-up visits. Contact a doctor if: You have fluid coming from your nipple, especially if the fluid has blood in it. You have new lumps or bumps in your breast. Your breast gets swollen, red, and painful. You have changes in how your breast looks. Your nipple looks flat or it sinks into your breast. Get help right away if: Your breast turns red, and the redness is spreading. Summary Fibrocystic breast changes are changes that can make your breasts swollen, lumpy, or painful. This condition can happen when you have hormone changes during your monthly period. Check your breasts after every monthly period. If you do not have monthly periods, check your breasts on the first day of every month. This information is not intended to replace advice given to you by your health care provider. Make sure you discuss any questions you have with your health care provider. Document Revised: 05/07/2021 Document Reviewed: 05/07/2021 Elsevier Patient Education  [place].

## 2022-04-17 ENCOUNTER — Encounter: Payer: Self-pay | Admitting: Nurse Practitioner

## 2022-08-07 ENCOUNTER — Encounter: Payer: Self-pay | Admitting: Nurse Practitioner

## 2022-08-07 ENCOUNTER — Ambulatory Visit (INDEPENDENT_AMBULATORY_CARE_PROVIDER_SITE_OTHER): Payer: Medicare Other | Admitting: Nurse Practitioner

## 2022-08-07 VITALS — BP 120/70 | HR 84 | Temp 98.1°F | Ht 68.75 in | Wt 172.0 lb

## 2022-08-07 DIAGNOSIS — R300 Dysuria: Secondary | ICD-10-CM | POA: Diagnosis not present

## 2022-08-07 DIAGNOSIS — N3000 Acute cystitis without hematuria: Secondary | ICD-10-CM | POA: Diagnosis not present

## 2022-08-07 MED ORDER — NITROFURANTOIN MONOHYD MACRO 100 MG PO CAPS: 100.0000 mg | ORAL_CAPSULE | Freq: Two times a day (BID) | ORAL | 0 refills | Status: AC

## 2022-08-07 NOTE — Addendum Note (Signed)
Addended by: [name] on: 08/07/2022 03:34 PM   Modules accepted: Orders

## 2022-08-07 NOTE — Progress Notes (Addendum)
Assessment and Plan:  Elexys Henley was seen today for a .  Diagnoses and all order for this visit:  Acute cystitis without hematuria Stay well hydrated to keep urinary system well flushed Consider daily cranberry juice or oral supplement to help any bacteria from adhering to bladder wall causing increase for infection. Monitor for any increase in fever, chills, N/V, abdominal pain, hematuria.   Contact office or report to ER for further evaluation if s/s fail to improve or any sign of worsening infection as noted above.    - nitrofurantoin, macrocrystal-monohydrate, (MACROBID) 100 MG capsule; Take 1 capsule (100 mg total) by mouth 2 (two) times daily for 7 days.  Dispense: 14 capsule; Refill: 0 - Urinalysis, Routine w reflex microscopic - Urine Culture  Dysuria     Notify office for further evaluation and treatment, questions or concerns if s/s fail to improve. The risks and benefits of my recommendations, as well as other treatment options were discussed with the patient today. Questions were answered.  Further disposition pending results of labs. Discussed med's effects and SE's.    Over 15 minutes of exam, counseling, chart review, and critical decision making was performed.   Future Appointments  Date Time Provider Department Center  10/30/2022  2:00 PM Raynelle Dick, NP [facility] None    ------------------------------------------------------------------------------------------------------------------   HPI BP 120/70   Pulse 84   Temp 98.1 F (36.7 C)   Ht 5' 8.75" (1.746 m)   Wt 172 lb (78 kg)   SpO2 98%   BMI 25.59 kg/m   Patient complains of burning with urination and frequency. She has had symptoms for 2 days. Patient denies back pain, fever, stomach ache, and vaginal discharge. Patient does not have a history of recurrent UTI. Patient does not have a history of pyelonephritis.   Past Medical History:  Diagnosis Date   Allergy    Anxiety    Depression     Endometriosis    Fibrocystic breast disease    Vitamin D deficiency      Allergies  Allergen Reactions   Sulfa Antibiotics     Current Outpatient Medications on File Prior to Visit  Medication Sig   B Complex Vitamins (B COMPLEX PO) Take by mouth daily.   Cholecalciferol (VITAMIN D PO) Take 3,600 Int'l Units by mouth daily.   OVER THE COUNTER MEDICATION daily. VITALIZER STRIP   OVER THE COUNTER MEDICATION daily. NUTRIFERON   OVER THE COUNTER MEDICATION daily. OSTEOMATRIX   OVER THE COUNTER MEDICATION daily. SOY PROTEIN DRINK IN A.M.   sertraline (ZOLOFT) 50 MG tablet Take  1 tablet  Daily  for Mood   No current facility-administered medications on file prior to visit.    ROS: all negative except what is noted in the HPI.   Physical Exam:  BP 120/70   Pulse 84   Temp 98.1 F (36.7 C)   Ht 5' 8.75" (1.746 m)   Wt 172 lb (78 kg)   SpO2 98%   BMI 25.59 kg/m   General Appearance: NAD.  Awake, conversant and cooperative. Eyes: PERRLA, EOMs intact.  Sclera white.  Conjunctiva without erythema. Sinuses: No frontal/maxillary tenderness.  No nasal discharge. Nares patent.  ENT/Mouth: Ext aud canals clear.  Bilateral TMs w/DOL and without erythema or bulging. Hearing intact.  Posterior pharynx without swelling or exudate.  Tonsils without swelling or erythema.  Neck: Supple.  No masses, nodules or thyromegaly. Respiratory: Effort is regular with non-labored breathing. Breath sounds are equal bilaterally without  rales, rhonchi, wheezing or stridor.  Cardio: RRR with no MRGs. Brisk peripheral pulses without edema.  Abdomen: Active BS in all four quadrants.  Soft and non-tender without guarding, rebound tenderness, hernias or masses. Lymphatics: Non tender without lymphadenopathy.  Musculoskeletal: Full ROM, 5/5 strength, normal ambulation.  No clubbing or cyanosis. Skin: Appropriate color for ethnicity. Warm without rashes, lesions, ecchymosis, ulcers.  Neuro: CN II-XII grossly  normal. Normal muscle tone without cerebellar symptoms and intact sensation.   Psych: AO X 3,  appropriate mood and affect, insight and judgment.     [name], NP 2:57 PM [facility]

## 2022-08-07 NOTE — Patient Instructions (Signed)
Urinary Tract Infection, Adult A urinary tract infection (UTI) is an infection of any part of the urinary tract. The urinary tract includes: The kidneys. The ureters. The bladder. The urethra. These organs make, store, and get rid of pee (urine) in the body. What are the causes? This infection is caused by germs (bacteria) in your genital area. These germs grow and cause swelling (inflammation) of your urinary tract. What increases the risk? The following factors may make you more likely to develop this condition: Using a small, thin tube (catheter) to drain pee. Not being able to control when you pee or poop (incontinence). Being female. If you are female, these things can increase the risk: Using these methods to prevent pregnancy: A medicine that kills sperm (spermicide). A device that blocks sperm (diaphragm). Having low levels of a female hormone (estrogen). Being pregnant. You are more likely to develop this condition if: You have genes that add to your risk. You are sexually active. You take antibiotic medicines. You have trouble peeing because of: A prostate that is bigger than normal, if you are female. A blockage in the part of your body that drains pee from the bladder. A kidney stone. A nerve condition that affects your bladder. Not getting enough to drink. Not peeing often enough. You have other conditions, such as: Diabetes. A weak disease-fighting system (immune system). Sickle cell disease. Gout. Injury of the spine. What are the signs or symptoms? Symptoms of this condition include: Needing to pee right away. Peeing small amounts often. Pain or burning when peeing. Blood in the pee. Pee that smells bad or not like normal. Trouble peeing. Pee that is cloudy. Fluid coming from the vagina, if you are female. Pain in the belly or lower back. Other symptoms include: Vomiting. Not feeling hungry. Feeling mixed up (confused). This may be the first symptom in  older adults. Being tired and grouchy (irritable). A fever. Watery poop (diarrhea). How is this treated? Taking antibiotic medicine. Taking other medicines. Drinking enough water. In some cases, you may need to see a specialist. Follow these instructions at home:  Medicines Take over-the-counter and prescription medicines only as told by your doctor. If you were prescribed an antibiotic medicine, take it as told by your doctor. Do not stop taking it even if you start to feel better. General instructions Make sure you: Pee until your bladder is empty. Do not hold pee for a long time. Empty your bladder after sex. Wipe from front to back after peeing or pooping if you are a female. Use each tissue one time when you wipe. Drink enough fluid to keep your pee pale yellow. Keep all follow-up visits. Contact a doctor if: You do not get better after 1-2 days. Your symptoms go away and then come back. Get help right away if: You have very bad back pain. You have very bad pain in your lower belly. You have a fever. You have chills. You feeling like you will vomit or you vomit. Summary A urinary tract infection (UTI) is an infection of any part of the urinary tract. This condition is caused by germs in your genital area. There are many risk factors for a UTI. Treatment includes antibiotic medicines. Drink enough fluid to keep your pee pale yellow. This information is not intended to replace advice given to you by your health care provider. Make sure you discuss any questions you have with your health care provider. Document Revised: 10/28/2019 Document Reviewed: 10/28/2019 Elsevier Patient Education    2023 Elsevier Inc.  

## 2022-08-08 LAB — URINE CULTURE
MICRO NUMBER:: 14936617
SPECIMEN QUALITY:: ADEQUATE

## 2022-08-08 LAB — URINALYSIS, ROUTINE W REFLEX MICROSCOPIC
Bacteria, UA: NONE SEEN /HPF
Bilirubin Urine: NEGATIVE
Glucose, UA: NEGATIVE
Hgb urine dipstick: NEGATIVE
Hyaline Cast: NONE SEEN /LPF
Ketones, ur: NEGATIVE
Nitrite: NEGATIVE
Specific Gravity, Urine: 1.025 (ref 1.001–1.035)
pH: 6 (ref 5.0–8.0)

## 2022-08-08 LAB — MICROSCOPIC MESSAGE

## 2022-08-11 ENCOUNTER — Encounter: Payer: Self-pay | Admitting: Nurse Practitioner

## 2022-08-18 LAB — HM DEXA SCAN

## 2022-08-18 LAB — HM MAMMOGRAPHY

## 2022-08-20 ENCOUNTER — Encounter: Payer: Self-pay | Admitting: Internal Medicine

## 2022-10-29 ENCOUNTER — Other Ambulatory Visit: Payer: Self-pay | Admitting: Internal Medicine

## 2022-10-29 NOTE — Progress Notes (Unsigned)
Complete Physical  Assessment and Plan:  Colleen Alexander was seen today for annual exam.  Diagnoses and all orders for this visit:  Encounter for medicare annual wellness visit with abnormal findings - Due annually - Mammogram UTD 08/18/22- benign - Colonoscopy 2018 due 2028 - DEXA T score -1.1 osteopenia  Mixed Hyperlipidemia  Currently not on medication, attempting to lower with diet and exercise -     COMPLETE METABOLIC PANEL WITH GFR -     Lipid panel   Abnormal Glucose Focus on diet and exercise - A1c  Fibrocystic breast disease (FCBD), unspecified laterality       - Mammogram normal 07/2022, continue monthly breast exams and limiting caffeine  Depression, major, in remission (HCC)       - Continue Sertraline 50 mg every day     Continue behavior modifications  Vitamin D deficiency Encouraged to use Vit D supplementation -     VITAMIN D 25 Hydroxy (Vit-D Deficiency, Fractures)  Medication management -     CBC with Differential/Platelet -     COMPLETE METABOLIC PANEL WITH GFR -     Lipid panel -     TSH -     VITAMIN D 25 Hydroxy (Vit-D Deficiency, Fractures) -     Magnesium -     EKG 12-Lead -     Urinalysis, Routine w reflex microscopic -     Microalbumin / creatinine urine ratio     Future Appointments  Date Time Provider Department Center  11/02/2023  2:00 PM Raynelle Dick, NP [facility] None     Discussed med's effects and SE's. Screening labs and tests as requested with regular follow-up as recommended.  Plan:   During the course of the visit the patient was educated and counseled about appropriate screening and preventive services including:   Pneumococcal vaccine  Prevnar 13 Influenza vaccine Td vaccine Screening electrocardiogram Bone densitometry screening Colorectal cancer screening Diabetes screening Glaucoma screening Nutrition counseling  Advanced directives: requested   HPI 67 y.o. female  presents for a complete physical. She has  history of hypertriglyceridemia, Anxiety, Depression, Fibrocystic breast disease, Vitamin D deficiency  Her blood pressure has been controlled at home without medication, today their BP is BP: 102/62   BP Readings from Last 3 Encounters:  10/30/22 102/62  08/07/22 120/70  04/14/22 112/74  Denies chest pain, shortness of breath, dizziness, and headaches. She does workout, works out with Psychologist, educational.  She denies chest pain, shortness of breath, dizziness.   She is not on cholesterol medication. Dad with MI at 24 but was smoker/drinker, no other family history of MI, MGM with stroke in 28's.  Her cholesterol is not at goal. The cholesterol last visit was:   Lab Results  Component Value Date   CHOL 209 (H) 10/29/2021   HDL 67 10/29/2021   LDLCALC 119 (H) 10/29/2021   TRIG 119 10/29/2021   CHOLHDL 3.1 10/29/2021   BMI is Body mass index is 25.61 kg/m., she has been working on diet and exercise. She has been weighing 171 at home. She is walking consistently 1 m ile 3-4 times a week, also doing strength training Wt Readings from Last 3 Encounters:  10/30/22 172 lb 3.2 oz (78.1 kg)  08/07/22 172 lb (78 kg)  04/14/22 175 lb (79.4 kg)    Patient is not on Vit D supplement Lab Results  Component Value Date   VD25OH 47 10/29/2021   She is on zoloft for depression/anxiety which helps.  She is  teaching wellness classes and grief counseling.   Lab Results  Component Value Date   HGBA1C 5.0 10/29/2020     Current Medications:  Current Outpatient Medications on File Prior to Visit  Medication Sig Dispense Refill   B Complex Vitamins (B COMPLEX PO) Take by mouth daily.     Cholecalciferol (VITAMIN D PO) Take 3,600 Int'l Units by mouth daily.     OVER THE COUNTER MEDICATION daily. VITALIZER STRIP     OVER THE COUNTER MEDICATION daily. NUTRIFERON     OVER THE COUNTER MEDICATION daily. OSTEOMATRIX     OVER THE COUNTER MEDICATION daily. SOY PROTEIN DRINK IN A.M.     OVER THE COUNTER MEDICATION  Carotmax     sertraline (ZOLOFT) 50 MG tablet TAKE 1 TABLET BY MOUTH DAILY FOR MOOD 90 tablet 3   No current facility-administered medications on file prior to visit.   Health Maintenance:   Immunization History  Administered Date(s) Administered   Influenza,inj,quad, With Preservative 02/04/2021   PFIZER(Purple Top)SARS-COV-2 Vaccination 06/15/2019, 07/06/2019   Pfizer Covid-19 Vaccine Bivalent Booster 67yrs & up 02/04/2021   Tdap 06/27/2013   Health Maintenance  Topic Date Due   COVID-19 Vaccine (4 - 2023-24 season) 11/15/2022 (Originally 11/29/2021)   Zoster Vaccines- Shingrix (1 of 2) 01/30/2023 (Originally 08/08/1974)   INFLUENZA VACCINE  06/29/2023 (Originally 10/30/2022)   Pneumonia Vaccine 33+ Years old (1 of 1 - PCV) 10/30/2023 (Originally 08/07/2021)   DTaP/Tdap/Td (2 - Td or Tdap) 06/28/2023   MAMMOGRAM  08/18/2023   Medicare Annual Wellness (AWV)  10/30/2023   Colonoscopy  01/20/2027   DEXA SCAN  Completed   Hepatitis C Screening  Completed   HPV VACCINES  Aged Out    Health Maintenance  Topic Date Due   COVID-19 Vaccine (4 - 2023-24 season) 11/15/2022 (Originally 11/29/2021)   Zoster Vaccines- Shingrix (1 of 2) 01/30/2023 (Originally [birth year]1975)   INFLUENZA VACCINE  06/29/2023 (Originally 10/30/2022)   Pneumonia Vaccine 25+ Years old (1 of 1 - PCV) 10/30/2023 (Originally 08/07/2021)   DTaP/Tdap/Td (2 - Td or Tdap) 06/28/2023   MAMMOGRAM  08/18/2023   Medicare Annual Wellness (AWV)  10/30/2023   Colonoscopy  01/20/2027   DEXA SCAN  Completed   Hepatitis C Screening  Completed   HPV VACCINES  Aged Out     Medical History:  Past Medical History:  Diagnosis Date   Allergy    Anxiety    Depression    Endometriosis    Fibrocystic breast disease    Vitamin D deficiency    Allergies Allergies  Allergen Reactions   Sulfa Antibiotics   EYE DOCTOR: [facility] 2024 DENTIST: Dr. Delane Ginger 2024 Dermatologist: Dr. Sharyn Lull 2024  SURGICAL HISTORY She  has a past  surgical history that includes LASIK and laparotomy. FAMILY HISTORY Her family history includes COPD in her father; Diabetes in her father; Heart disease in her father; Hypertension in her father and mother. SOCIAL HISTORY She  reports that she has never smoked. She has never used smokeless tobacco. She reports that she does not drink alcohol and does not use drugs.  MEDICARE WELLNESS OBJECTIVES: Physical activity:  Walking 1 mile 3-4 weeks.  Strength training Cardiac risk factors: Cardiac Risk Factors include: dyslipidemia;advanced age (>88men, >37 women) Depression/mood screen:      10/30/2022    2:18 PM  Depression screen PHQ 2/9  Decreased Interest 0  Down, Depressed, Hopeless 0  PHQ - 2 Score 0    ADLs:     10/30/2022  2:17 PM  In your present state of health, do you have any difficulty performing the following activities:  Hearing? 0  Vision? 0  Difficulty concentrating or making decisions? 0  Walking or climbing stairs? 0  Dressing or bathing? 0  Doing errands, shopping? 0     Cognitive Testing  Alert? Yes  Normal Appearance?Yes  Oriented to person? Yes  Place? Yes   Time? Yes  Recall of three objects?  Yes  Can perform simple calculations? Yes  Displays appropriate judgment?Yes  Can read the correct time from a watch face?Yes  EOL planning: Does Patient Have a Medical Advance Directive?: Yes Type of Advance Directive: Healthcare Power of Attorney, Living will Does patient want to make changes to medical advance directive?: No - Patient declined Copy of Healthcare Power of Attorney in Chart?: No - copy requested    Review of Systems  Constitutional: Negative.  Negative for chills and fever.  HENT: Negative.  Negative for congestion, hearing loss, sinus pain, sore throat and tinnitus.   Eyes: Negative.  Negative for blurred vision and double vision.  Respiratory: Negative.  Negative for cough, hemoptysis, sputum production, shortness of breath and wheezing.    Cardiovascular: Negative.  Negative for chest pain, palpitations and leg swelling.  Gastrointestinal: Negative.  Negative for abdominal pain, constipation, diarrhea, heartburn, nausea and vomiting.  Genitourinary: Negative.  Negative for dysuria and urgency.  Musculoskeletal: Negative.  Negative for back pain, falls, joint pain, myalgias and neck pain.  Skin: Negative.  Negative for itching and rash.  Neurological: Negative.  Negative for dizziness, tingling, tremors, weakness and headaches.  Endo/Heme/Allergies: Negative.  Does not bruise/bleed easily.  Psychiatric/Behavioral: Negative.  Negative for depression and suicidal ideas. The patient is not nervous/anxious and does not have insomnia.     Physical Exam: Estimated body mass index is 25.61 kg/m as calculated from the following:   Height as of this encounter: 5' 8.75" (1.746 m).   Weight as of this encounter: 172 lb 3.2 oz (78.1 kg). BP 102/62   Pulse 87   Temp 97.9 F (36.6 C)   Ht 5' 8.75" (1.746 m)   Wt 172 lb 3.2 oz (78.1 kg)   SpO2 98%   BMI 25.61 kg/m  General Appearance: Well nourished, in no apparent distress. Eyes: PERRLA, EOMs, conjunctiva no swelling or erythema Sinuses: No Frontal/maxillary tenderness ENT/Mouth: Ext aud canals clear, normal light reflex with TMs without erythema, bulging.  Good dentition. No erythema, swelling, or exudate on post pharynx.  Hearing normal.  Neck: Supple, thyroid normal. No bruits Respiratory: Respiratory effort normal, BS equal bilaterally without rales, rhonchi, wheezing or stridor. Cardio: RRR without murmurs, rubs or gallops. Brisk peripheral pulses without edema.  Chest: symmetric, with normal excursions and percussion. Breasts:Defer , Gets mammograms yearly- WNL and UTD Abdomen: Soft, +BS. Non tender, no guarding, rebound, hernias, masses, or organomegaly. .  Lymphatics: Non tender without lymphadenopathy.  Genitourinary: Defer, no issues Musculoskeletal: Full ROM all  peripheral extremities,5/5 strength, and normal gait.  Skin: Warm, dry without rashes,ecchymosis.  Neuro: Cranial nerves intact, reflexes equal bilaterally. Normal muscle tone, no cerebellar symptoms. Sensation intact.  Psych: Awake and oriented X 3, normal affect, Insight and Judgment appropriate.   EKG:  NSR, no ST changes   Medicare Attestation I have personally reviewed: The patient's medical and social history Their use of alcohol, tobacco or illicit drugs Their current medications and supplements The patient's functional ability including ADLs,fall risks, home safety risks, cognitive, and hearing and visual impairment  Diet and physical activities Evidence for depression or mood disorders  The patient's weight, height, BMI, and visual acuity have been recorded in the chart.  I have made referrals, counseling, and provided education to the patient based on review of the above and I have provided the patient with a written personalized care plan for preventive services.     Revonda Humphrey ANP-C  [facility]  10/30/2022

## 2022-10-30 ENCOUNTER — Encounter: Payer: Self-pay | Admitting: Nurse Practitioner

## 2022-10-30 ENCOUNTER — Ambulatory Visit (INDEPENDENT_AMBULATORY_CARE_PROVIDER_SITE_OTHER): Payer: Medicare Other | Admitting: Nurse Practitioner

## 2022-10-30 VITALS — BP 102/62 | HR 87 | Temp 97.9°F | Ht 68.75 in | Wt 172.2 lb

## 2022-10-30 DIAGNOSIS — Z136 Encounter for screening for cardiovascular disorders: Secondary | ICD-10-CM | POA: Diagnosis not present

## 2022-10-30 DIAGNOSIS — E559 Vitamin D deficiency, unspecified: Secondary | ICD-10-CM

## 2022-10-30 DIAGNOSIS — F325 Major depressive disorder, single episode, in full remission: Secondary | ICD-10-CM

## 2022-10-30 DIAGNOSIS — Z79899 Other long term (current) drug therapy: Secondary | ICD-10-CM

## 2022-10-30 DIAGNOSIS — I10 Essential (primary) hypertension: Secondary | ICD-10-CM

## 2022-10-30 DIAGNOSIS — Z0000 Encounter for general adult medical examination without abnormal findings: Secondary | ICD-10-CM

## 2022-10-30 DIAGNOSIS — R7309 Other abnormal glucose: Secondary | ICD-10-CM | POA: Diagnosis not present

## 2022-10-30 DIAGNOSIS — N6019 Diffuse cystic mastopathy of unspecified breast: Secondary | ICD-10-CM

## 2022-10-30 DIAGNOSIS — E782 Mixed hyperlipidemia: Secondary | ICD-10-CM | POA: Diagnosis not present

## 2022-10-30 DIAGNOSIS — Z0001 Encounter for general adult medical examination with abnormal findings: Secondary | ICD-10-CM

## 2022-10-30 NOTE — Patient Instructions (Signed)

## 2022-11-27 ENCOUNTER — Encounter: Payer: Self-pay | Admitting: Nurse Practitioner

## 2022-11-27 ENCOUNTER — Ambulatory Visit (INDEPENDENT_AMBULATORY_CARE_PROVIDER_SITE_OTHER): Payer: Medicare Other | Admitting: Nurse Practitioner

## 2022-11-27 VITALS — BP 118/70 | HR 82 | Temp 97.0°F | Ht 68.75 in | Wt 173.8 lb

## 2022-11-27 DIAGNOSIS — R3915 Urgency of urination: Secondary | ICD-10-CM | POA: Diagnosis not present

## 2022-11-27 DIAGNOSIS — R350 Frequency of micturition: Secondary | ICD-10-CM

## 2022-11-27 DIAGNOSIS — R300 Dysuria: Secondary | ICD-10-CM | POA: Diagnosis not present

## 2022-11-27 MED ORDER — NITROFURANTOIN MONOHYD MACRO 100 MG PO CAPS
100.0000 mg | ORAL_CAPSULE | Freq: Two times a day (BID) | ORAL | 0 refills | Status: AC
Start: 2022-11-27 — End: 2022-12-02

## 2022-11-27 NOTE — Patient Instructions (Signed)
Urinary Tract Infection, Adult A urinary tract infection (UTI) is an infection of any part of the urinary tract. The urinary tract includes: The kidneys. The ureters. The bladder. The urethra. These organs make, store, and get rid of pee (urine) in the body. What are the causes? This infection is caused by germs (bacteria) in your genital area. These germs grow and cause swelling (inflammation) of your urinary tract. What increases the risk? The following factors may make you more likely to develop this condition: Using a small, thin tube (catheter) to drain pee. Not being able to control when you pee or poop (incontinence). Being female. If you are female, these things can increase the risk: Using these methods to prevent pregnancy: A medicine that kills sperm (spermicide). A device that blocks sperm (diaphragm). Having low levels of a female hormone (estrogen). Being pregnant. You are more likely to develop this condition if: You have genes that add to your risk. You are sexually active. You take antibiotic medicines. You have trouble peeing because of: A prostate that is bigger than normal, if you are female. A blockage in the part of your body that drains pee from the bladder. A kidney stone. A nerve condition that affects your bladder. Not getting enough to drink. Not peeing often enough. You have other conditions, such as: Diabetes. A weak disease-fighting system (immune system). Sickle cell disease. Gout. Injury of the spine. What are the signs or symptoms? Symptoms of this condition include: Needing to pee right away. Peeing small amounts often. Pain or burning when peeing. Blood in the pee. Pee that smells bad or not like normal. Trouble peeing. Pee that is cloudy. Fluid coming from the vagina, if you are female. Pain in the belly or lower back. Other symptoms include: Vomiting. Not feeling hungry. Feeling mixed up (confused). This may be the first symptom in  older adults. Being tired and grouchy (irritable). A fever. Watery poop (diarrhea). How is this treated? Taking antibiotic medicine. Taking other medicines. Drinking enough water. In some cases, you may need to see a specialist. Follow these instructions at home:  Medicines Take over-the-counter and prescription medicines only as told by your doctor. If you were prescribed an antibiotic medicine, take it as told by your doctor. Do not stop taking it even if you start to feel better. General instructions Make sure you: Pee until your bladder is empty. Do not hold pee for a long time. Empty your bladder after sex. Wipe from front to back after peeing or pooping if you are a female. Use each tissue one time when you wipe. Drink enough fluid to keep your pee pale yellow. Keep all follow-up visits. Contact a doctor if: You do not get better after 1-2 days. Your symptoms go away and then come back. Get help right away if: You have very bad back pain. You have very bad pain in your lower belly. You have a fever. You have chills. You feeling like you will vomit or you vomit. Summary A urinary tract infection (UTI) is an infection of any part of the urinary tract. This condition is caused by germs in your genital area. There are many risk factors for a UTI. Treatment includes antibiotic medicines. Drink enough fluid to keep your pee pale yellow. This information is not intended to replace advice given to you by your health care provider. Make sure you discuss any questions you have with your health care provider. Document Revised: 10/23/2019 Document Reviewed: 10/28/2019 Elsevier Patient Education    2024 Elsevier Inc.  

## 2022-11-27 NOTE — Progress Notes (Signed)
Assessment and Plan:  Colleen Alexander was seen today for a .  Diagnoses and all order for this visit:  Dysuria Stay well hydrated to keep urinary system well flushed Consider daily cranberry juice or oral supplement to help any bacteria from adhering to bladder wall causing increase for infection. Monitor for any increase in fever, chills, N/V, abdominal pain, hematuria.   Contact office or report to ER for further evaluation if s/s fail to improve or any sign of worsening infection as noted above.  - Urinalysis, Routine w reflex microscopic - Urine Culture - nitrofurantoin, macrocrystal-monohydrate, (MACROBID) 100 MG capsule; Take 1 capsule (100 mg total) by mouth 2 (two) times daily for 5 days.  Dispense: 10 capsule; Refill: 0  Urinary frequency/urgency  Notify office for further evaluation and treatment, questions or concerns if s/s fail to improve. The risks and benefits of my recommendations, as well as other treatment options were discussed with the patient today. Questions were answered.  Further disposition pending results of labs. Discussed med's effects and SE's.    Over 15 minutes of exam, counseling, chart review, and critical decision making was performed.   Future Appointments  Date Time Provider Department Center  05/07/2023  2:30 PM Lucky Cowboy, MD [facility] None  11/02/2023  2:00 PM Raynelle Dick, NP [facility] None    ------------------------------------------------------------------------------------------------------------------   HPI BP 118/70   Pulse 82   Temp (!) 97 F (36.1 C)   Ht 5' 8.75" (1.746 m)   Wt 173 lb 12.8 oz (78.8 kg)   SpO2 98%   BMI 25.85 kg/m    Patient complains of dysuria, frequency, and urgency. She has had symptoms for 3 days. Patient denies back pain, fever, headache, stomach ache, and vaginal discharge. Patient does not have a history of recurrent UTI. Patient does not have a history of pyelonephritis.    Past Medical  History:  Diagnosis Date   Allergy    Anxiety    Depression    Endometriosis    Fibrocystic breast disease    Vitamin D deficiency      Allergies  Allergen Reactions   Sulfa Antibiotics     Current Outpatient Medications on File Prior to Visit  Medication Sig   B Complex Vitamins (B COMPLEX PO) Take by mouth daily.   Cholecalciferol (VITAMIN D PO) Take 3,600 Int'l Units by mouth daily.   OVER THE COUNTER MEDICATION daily. VITALIZER STRIP   OVER THE COUNTER MEDICATION daily. NUTRIFERON   OVER THE COUNTER MEDICATION daily. OSTEOMATRIX   OVER THE COUNTER MEDICATION daily. SOY PROTEIN DRINK IN A.M.   OVER THE COUNTER MEDICATION Carotmax   sertraline (ZOLOFT) 50 MG tablet TAKE 1 TABLET BY MOUTH DAILY FOR MOOD   No current facility-administered medications on file prior to visit.    ROS: all negative except what is noted in the HPI.   Physical Exam:  BP 118/70   Pulse 82   Temp (!) 97 F (36.1 C)   Ht 5' 8.75" (1.746 m)   Wt 173 lb 12.8 oz (78.8 kg)   SpO2 98%   BMI 25.85 kg/m   General Appearance: NAD.  Awake, conversant and cooperative. Eyes: PERRLA, EOMs intact.  Sclera white.  Conjunctiva without erythema. Sinuses: No frontal/maxillary tenderness.  No nasal discharge. Nares patent.  ENT/Mouth: Ext aud canals clear.  Bilateral TMs w/DOL and without erythema or bulging. Hearing intact.  Posterior pharynx without swelling or exudate.  Tonsils without swelling or erythema.  Neck: Supple.  No  masses, nodules or thyromegaly. Respiratory: Effort is regular with non-labored breathing. Breath sounds are equal bilaterally without rales, rhonchi, wheezing or stridor.  Cardio: RRR with no MRGs. Brisk peripheral pulses without edema.  Abdomen: Active BS in all four quadrants.  Soft and non-tender without guarding, rebound tenderness, hernias or masses. Lymphatics: Non tender without lymphadenopathy.  Musculoskeletal: Full ROM, 5/5 strength, normal ambulation.  No clubbing or  cyanosis. Skin: Appropriate color for ethnicity. Warm without rashes, lesions, ecchymosis, ulcers.  Neuro: CN II-XII grossly normal. Normal muscle tone without cerebellar symptoms and intact sensation.   Psych: AO X 3,  appropriate mood and affect, insight and judgment.     Colleen Glimpse, NP 3:22 PM [facility]

## 2022-11-29 LAB — URINALYSIS, ROUTINE W REFLEX MICROSCOPIC
Bilirubin Urine: NEGATIVE
Glucose, UA: NEGATIVE
Hgb urine dipstick: NEGATIVE
Hyaline Cast: NONE SEEN /LPF
Ketones, ur: NEGATIVE
Nitrite: POSITIVE — AB
Specific Gravity, Urine: 1.014 (ref 1.001–1.035)
Squamous Epithelial / HPF: NONE SEEN /HPF (ref <=5)
pH: 6 (ref 5.0–8.0)

## 2022-11-29 LAB — URINE CULTURE
MICRO NUMBER:: 15400917
SPECIMEN QUALITY:: ADEQUATE

## 2022-11-29 LAB — MICROSCOPIC MESSAGE

## 2023-05-07 ENCOUNTER — Ambulatory Visit: Payer: Medicare Other | Admitting: Internal Medicine

## 2023-10-25 ENCOUNTER — Other Ambulatory Visit: Payer: Self-pay | Admitting: Nurse Practitioner

## 2023-11-02 ENCOUNTER — Encounter: Payer: Medicare Other | Admitting: Nurse Practitioner
